# Patient Record
Sex: Female | Born: 1940 | Race: White | Hispanic: No | Smoking: Never smoker
Health system: Southern US, Community
[De-identification: ages and names within clinical notes are randomized; demographics above are authoritative.]

## PROBLEM LIST (undated history)

## (undated) DIAGNOSIS — E785 Hyperlipidemia, unspecified: Secondary | ICD-10-CM

## (undated) DIAGNOSIS — M1990 Unspecified osteoarthritis, unspecified site: Secondary | ICD-10-CM

## (undated) DIAGNOSIS — H269 Unspecified cataract: Secondary | ICD-10-CM

## (undated) DIAGNOSIS — E079 Disorder of thyroid, unspecified: Secondary | ICD-10-CM

## (undated) DIAGNOSIS — I10 Essential (primary) hypertension: Secondary | ICD-10-CM

## (undated) HISTORY — DX: Unspecified cataract: H26.9

## (undated) HISTORY — DX: Disorder of thyroid, unspecified: E07.9

## (undated) HISTORY — DX: Unspecified osteoarthritis, unspecified site: M19.90

## (undated) HISTORY — DX: Hyperlipidemia, unspecified: E78.5

## (undated) HISTORY — DX: Essential (primary) hypertension: I10

## (undated) HISTORY — PX: EYE SURGERY: SHX253

---

## 1985-04-21 HISTORY — PX: DILATION AND CURETTAGE OF UTERUS: SHX78

## 1985-04-21 HISTORY — PX: ABDOMINAL HYSTERECTOMY: SHX81

## 1999-07-17 ENCOUNTER — Encounter: Admission: RE | Admit: 1999-07-17 | Discharge: 1999-07-17 | Payer: Self-pay | Admitting: Obstetrics and Gynecology

## 1999-07-17 ENCOUNTER — Encounter: Payer: Self-pay | Admitting: Obstetrics and Gynecology

## 2000-07-27 ENCOUNTER — Encounter: Admission: RE | Admit: 2000-07-27 | Discharge: 2000-07-27 | Payer: Self-pay | Admitting: Obstetrics and Gynecology

## 2000-07-27 ENCOUNTER — Encounter: Payer: Self-pay | Admitting: Obstetrics and Gynecology

## 2001-08-24 ENCOUNTER — Encounter: Payer: Self-pay | Admitting: Obstetrics and Gynecology

## 2001-08-24 ENCOUNTER — Encounter: Admission: RE | Admit: 2001-08-24 | Discharge: 2001-08-24 | Payer: Self-pay | Admitting: Obstetrics and Gynecology

## 2002-05-30 ENCOUNTER — Encounter: Payer: Self-pay | Admitting: Surgery

## 2002-05-31 ENCOUNTER — Observation Stay (HOSPITAL_COMMUNITY): Admission: RE | Admit: 2002-05-31 | Discharge: 2002-06-01 | Payer: Self-pay | Admitting: Surgery

## 2002-05-31 ENCOUNTER — Encounter (INDEPENDENT_AMBULATORY_CARE_PROVIDER_SITE_OTHER): Payer: Self-pay

## 2002-05-31 HISTORY — PX: CHOLECYSTECTOMY: SHX55

## 2002-09-19 ENCOUNTER — Encounter: Admission: RE | Admit: 2002-09-19 | Discharge: 2002-09-19 | Payer: Self-pay | Admitting: Obstetrics and Gynecology

## 2002-09-19 ENCOUNTER — Encounter: Payer: Self-pay | Admitting: Obstetrics and Gynecology

## 2003-10-18 ENCOUNTER — Ambulatory Visit (HOSPITAL_COMMUNITY): Admission: RE | Admit: 2003-10-18 | Discharge: 2003-10-18 | Payer: Self-pay | Admitting: Obstetrics and Gynecology

## 2004-10-21 ENCOUNTER — Ambulatory Visit (HOSPITAL_COMMUNITY): Admission: RE | Admit: 2004-10-21 | Discharge: 2004-10-21 | Payer: Self-pay | Admitting: Obstetrics and Gynecology

## 2005-10-23 ENCOUNTER — Ambulatory Visit (HOSPITAL_COMMUNITY): Admission: RE | Admit: 2005-10-23 | Discharge: 2005-10-23 | Payer: Self-pay | Admitting: Obstetrics and Gynecology

## 2006-10-28 ENCOUNTER — Ambulatory Visit (HOSPITAL_COMMUNITY): Admission: RE | Admit: 2006-10-28 | Discharge: 2006-10-28 | Payer: Self-pay | Admitting: Obstetrics and Gynecology

## 2007-11-01 ENCOUNTER — Ambulatory Visit (HOSPITAL_COMMUNITY): Admission: RE | Admit: 2007-11-01 | Discharge: 2007-11-01 | Payer: Self-pay | Admitting: Obstetrics and Gynecology

## 2008-11-01 ENCOUNTER — Ambulatory Visit (HOSPITAL_COMMUNITY): Admission: RE | Admit: 2008-11-01 | Discharge: 2008-11-01 | Payer: Self-pay | Admitting: Obstetrics and Gynecology

## 2009-11-05 ENCOUNTER — Ambulatory Visit (HOSPITAL_COMMUNITY): Admission: RE | Admit: 2009-11-05 | Discharge: 2009-11-05 | Payer: Self-pay | Admitting: Obstetrics and Gynecology

## 2010-08-05 ENCOUNTER — Encounter: Payer: Self-pay | Admitting: Family Medicine

## 2010-08-05 DIAGNOSIS — E079 Disorder of thyroid, unspecified: Secondary | ICD-10-CM | POA: Insufficient documentation

## 2010-08-05 DIAGNOSIS — I10 Essential (primary) hypertension: Secondary | ICD-10-CM

## 2010-08-05 DIAGNOSIS — E785 Hyperlipidemia, unspecified: Secondary | ICD-10-CM

## 2010-09-06 NOTE — Op Note (Signed)
NAME:  Sherry Cooper, Sherry Cooper                          ACCOUNT NO.:  [id number]   MEDICAL RECORD NO.:  [id number]                   PATIENT TYPE:  AMB   LOCATION:  DAY                                  FACILITY:  [facility]   PHYSICIAN:  Abigail Miyamoto, M.D.              DATE OF BIRTH:  05-03-1940   DATE OF PROCEDURE:  05/31/2002  DATE OF DISCHARGE:                                 OPERATIVE REPORT   PREOPERATIVE DIAGNOSIS:  Symptomatic cholelithiasis.   POSTOPERATIVE DIAGNOSIS:  Symptomatic cholelithiasis.   PROCEDURE:  Laparoscopic cholecystectomy.   SURGEON:  Abigail Miyamoto, M.D.   ASSISTANT:  Donnie Coffin. Samuella Cota, M.D.   ANESTHESIA:  General endotracheal anesthesia.   ESTIMATED BLOOD LOSS:  Minimal.   DESCRIPTION OF PROCEDURE:  The patient was brought to the operating room and  positively identified.  She was placed supine on the operating table and  general anesthesia was induced.  Her abdomen was then prepped and draped in  the usual sterile fashion.  Using a #15 blade, a small transverse incision  was made below the umbilicus.  The incision was carried down to the fascia,  which was then opened with a scalpel.  A hemostat was then used to pass  through the peritoneal cavity.  Next a balloon port was placed through the  incision of the umbilicus.  Insufflation of the abdomen was begun.  An 11 mm  port was placed in the patient's epigastrium and two 5 mm ports were placed  in the patient's right flank under direct vision.  The gallbladder was then  identified and grasped, retracted above the liver bed.  The gallbladder was  chronically scarred in appearance and appeared to also have a large stone.  The cystic duct was dissected out and clipped three times proximally, once  distally, and transected with the scissors.  The cystic artery was then  identified and clipped twice proximally, once distally, and transected as  well.  The gallbladder was then slowly dissected free from the liver  bed  with the electrocautery.  Once it was freed from the liver bed, it was  placed in an Endo Sac.  The liver bed was again examined and hemostasis was  felt to be achieved.  The gallbladder was then removed through the incision  at the umbilicus.  The abdomen was then again irrigated with normal saline  and again hemostasis appeared to be achieved.  All ports were removed under  direct vision and the abdomen was deflated.  A 0 Vicryl figure-of-eight  suture was then placed around the fascial opening at the umbilicus, closing  the fascial defect.  All incisions were then anesthetized with 0.25%  Marcaine and closed with 4-0 Monocryl subcuticular suture.  Steri-Strips,  gauze, and tape were then applied.  The patient tolerated the procedure  well.  All sponge, needle, and instrument counts were correct at the end of  the  procedure.  The patient was then extubated in the operating room and  taken in stable condition to the recovery room.                                               Abigail Miyamoto, M.D.    DB/MEDQ  D:  05/31/2002  T:  05/31/2002  Job:  [id number]

## 2010-10-28 ENCOUNTER — Other Ambulatory Visit: Payer: Self-pay | Admitting: Family Medicine

## 2010-10-28 DIAGNOSIS — Z1231 Encounter for screening mammogram for malignant neoplasm of breast: Secondary | ICD-10-CM

## 2010-11-08 ENCOUNTER — Ambulatory Visit (HOSPITAL_COMMUNITY)
Admission: RE | Admit: 2010-11-08 | Discharge: 2010-11-08 | Disposition: A | Discharge status: Home / Self Care | Payer: Medicare Other | Source: Ambulatory Visit | Attending: Family Medicine | Admitting: Family Medicine

## 2010-11-08 DIAGNOSIS — Z1231 Encounter for screening mammogram for malignant neoplasm of breast: Secondary | ICD-10-CM

## 2010-11-20 ENCOUNTER — Ambulatory Visit (HOSPITAL_COMMUNITY)
Admission: RE | Admit: 2010-11-20 | Discharge: 2010-11-20 | Disposition: A | Discharge status: Home / Self Care | Payer: Medicare Other | Source: Ambulatory Visit | Attending: Family Medicine | Admitting: Family Medicine

## 2010-11-20 DIAGNOSIS — Z1231 Encounter for screening mammogram for malignant neoplasm of breast: Secondary | ICD-10-CM | POA: Insufficient documentation

## 2011-01-29 ENCOUNTER — Encounter: Payer: Self-pay | Admitting: Gastroenterology

## 2011-02-11 ENCOUNTER — Ambulatory Visit (AMBULATORY_SURGERY_CENTER): Payer: Medicare Other | Admitting: *Deleted

## 2011-02-11 ENCOUNTER — Encounter: Payer: Self-pay | Admitting: Gastroenterology

## 2011-02-11 VITALS — Ht 58.5 in | Wt 173.9 lb

## 2011-02-11 DIAGNOSIS — Z1211 Encounter for screening for malignant neoplasm of colon: Secondary | ICD-10-CM

## 2011-02-11 MED ORDER — PEG-KCL-NACL-NASULF-NA ASC-C 100 G PO SOLR: ORAL | Status: DC

## 2011-02-25 ENCOUNTER — Ambulatory Visit (AMBULATORY_SURGERY_CENTER): Payer: Medicare Other | Admitting: Gastroenterology

## 2011-02-25 ENCOUNTER — Encounter: Payer: Self-pay | Admitting: Gastroenterology

## 2011-02-25 VITALS — BP 118/57 | HR 78 | Temp 97.6°F | Resp 18 | Ht 58.5 in | Wt 173.0 lb

## 2011-02-25 DIAGNOSIS — Z1211 Encounter for screening for malignant neoplasm of colon: Secondary | ICD-10-CM

## 2011-02-25 MED ORDER — SODIUM CHLORIDE 0.9 % IV SOLN: 500.0000 mL | INTRAVENOUS | Status: DC

## 2011-02-26 ENCOUNTER — Telehealth: Payer: Self-pay | Admitting: *Deleted

## 2011-02-26 NOTE — Telephone Encounter (Signed)
Follow up Call- Patient questions:  Do you have a fever, pain , or abdominal swelling? no Pain Score  0 *  Have you tolerated food without any problems? yes  Have you been able to return to your normal activities? yes  Do you have any questions about your discharge instructions: Diet   no Medications  yes answered Follow up visit  no  Do you have questions or concerns about your Care? no  Actions: * If pain score is 4 or above: No action needed, pain <4.

## 2011-11-03 ENCOUNTER — Other Ambulatory Visit: Payer: Self-pay | Admitting: Family Medicine

## 2011-11-03 DIAGNOSIS — Z1231 Encounter for screening mammogram for malignant neoplasm of breast: Secondary | ICD-10-CM

## 2011-11-25 ENCOUNTER — Ambulatory Visit (HOSPITAL_COMMUNITY)
Admission: RE | Admit: 2011-11-25 | Discharge: 2011-11-25 | Disposition: A | Discharge status: Home / Self Care | Payer: Medicare Other | Source: Ambulatory Visit | Attending: Family Medicine | Admitting: Family Medicine

## 2011-11-25 DIAGNOSIS — Z1231 Encounter for screening mammogram for malignant neoplasm of breast: Secondary | ICD-10-CM | POA: Insufficient documentation

## 2012-07-23 ENCOUNTER — Other Ambulatory Visit: Payer: Self-pay | Admitting: Family Medicine

## 2012-07-23 DIAGNOSIS — E039 Hypothyroidism, unspecified: Secondary | ICD-10-CM

## 2012-08-11 ENCOUNTER — Encounter: Payer: Self-pay | Admitting: *Deleted

## 2012-10-18 ENCOUNTER — Ambulatory Visit: Payer: Self-pay | Admitting: Family Medicine

## 2012-10-25 ENCOUNTER — Encounter: Payer: Self-pay | Admitting: Family Medicine

## 2012-10-25 ENCOUNTER — Telehealth: Payer: Self-pay | Admitting: Family Medicine

## 2012-10-25 ENCOUNTER — Ambulatory Visit (INDEPENDENT_AMBULATORY_CARE_PROVIDER_SITE_OTHER): Payer: Medicare Other | Admitting: Family Medicine

## 2012-10-25 VITALS — BP 130/71 | HR 97 | Temp 97.0°F | Ht 59.0 in | Wt 166.6 lb

## 2012-10-25 DIAGNOSIS — E785 Hyperlipidemia, unspecified: Secondary | ICD-10-CM

## 2012-10-25 DIAGNOSIS — I10 Essential (primary) hypertension: Secondary | ICD-10-CM

## 2012-10-25 DIAGNOSIS — E669 Obesity, unspecified: Secondary | ICD-10-CM

## 2012-10-25 DIAGNOSIS — R635 Abnormal weight gain: Secondary | ICD-10-CM

## 2012-10-25 DIAGNOSIS — E079 Disorder of thyroid, unspecified: Secondary | ICD-10-CM

## 2012-10-25 LAB — COMPLETE METABOLIC PANEL WITH GFR
ALT: 16 U/L (ref 0–35)
AST: 21 U/L (ref 0–37)
Albumin: 4.5 g/dL (ref 3.5–5.2)
Alkaline Phosphatase: 100 U/L (ref 39–117)
BUN: 18 mg/dL (ref 6–23)
CO2: 26 mEq/L (ref 19–32)
Calcium: 9.9 mg/dL (ref 8.4–10.5)
Chloride: 97 mEq/L (ref 96–112)
Creat: 1.16 mg/dL — ABNORMAL HIGH (ref 0.50–1.10)
GFR, Est African American: 55 mL/min — ABNORMAL LOW
GFR, Est Non African American: 47 mL/min — ABNORMAL LOW
Glucose, Bld: 105 mg/dL — ABNORMAL HIGH (ref 70–99)
Potassium: 4.5 mEq/L (ref 3.5–5.3)
Sodium: 133 mEq/L — ABNORMAL LOW (ref 135–145)
Total Bilirubin: 0.3 mg/dL (ref 0.3–1.2)
Total Protein: 7.4 g/dL (ref 6.0–8.3)

## 2012-10-25 LAB — TSH: TSH: 3.102 u[IU]/mL (ref 0.350–4.500)

## 2012-10-25 MED ORDER — ROSUVASTATIN CALCIUM 10 MG PO TABS: 10.0000 mg | ORAL_TABLET | Freq: Every day | ORAL | Status: DC

## 2012-10-25 MED ORDER — LISINOPRIL-HYDROCHLOROTHIAZIDE 10-12.5 MG PO TABS: 1.0000 | ORAL_TABLET | Freq: Every day | ORAL | Status: DC

## 2012-10-25 MED ORDER — LEVOTHYROXINE SODIUM 50 MCG PO TABS: 50.0000 ug | ORAL_TABLET | Freq: Every day | ORAL | Status: DC

## 2012-10-25 NOTE — Progress Notes (Signed)
Patient ID: Sherry Cooper, female   DOB: 10-28-1940, 72 y.o.   MRN: [id number] SUBJECTIVE: CC: Chief Complaint  Patient presents with  . Follow-up    4 month follow up . fastng   . Medication Refill    needs refills    HPI: Patient is here for follow up of hypertension/Hyperlipidemia/hypothyroidism/metabolic syndrome: denies Headache;deniesChest Pain;denies weakness;denies Shortness of Breath or Orthopnea;denies Visual changes;denies palpitations;denies cough;denies pedal edema;denies symptoms of TIA or stroke; admits to Compliance with medications. denies Problems with medications. Has problems of nausea with raw veggies.  Past Medical History  Diagnosis Date  . Thyroid disease   . Hypertension   . Hyperlipidemia   . Arthritis    Past Surgical History  Procedure Laterality Date  . Cholecystectomy  05/31/2002    laparoscopic  . Dilation and curettage of uterus  1987  . Abdominal hysterectomy  1987   History   Social History  . Marital Status: Widowed    Spouse Name: N/A    Number of Children: N/A  . Years of Education: N/A   Occupational History  . Not on file.   Social History Main Topics  . Smoking status: Never Smoker   . Smokeless tobacco: Not on file  . Alcohol Use: No  . Drug Use: No  . Sexually Active: Not on file   Other Topics Concern  . Not on file   Social History Narrative  . No narrative on file   Family History  Problem Relation Age of Onset  . Colon cancer Neg Hx   . Stomach cancer Neg Hx    Current Outpatient Prescriptions on File Prior to Visit  Medication Sig Dispense Refill  . aspirin 81 MG tablet Take 81 mg by mouth daily.        . Calcium-Vitamin D (CALTRATE 600 PLUS-VIT D PO) Take 2 tablets by mouth daily.       Marland Kitchen lisinopril-hydrochlorothiazide (PRINZIDE,ZESTORETIC) 10-12.5 MG per tablet Take 1 tablet by mouth daily.        . Multiple Vitamin (MULTIVITAMIN) tablet Take 1 tablet by mouth daily.        . Omega-3 Fatty Acids (OMEGA 3  PO) Take 2 g by mouth 2 (two) times daily.        . rosuvastatin (CRESTOR) 10 MG tablet Take 10 mg by mouth daily.         No current facility-administered medications on file prior to visit.   No Known Allergies  There is no immunization history on file for this patient. Prior to Admission medications   Medication Sig Start Date End Date Taking? Authorizing Provider  aspirin 81 MG tablet Take 81 mg by mouth daily.     Yes Historical Provider, MD  Calcium-Vitamin D (CALTRATE 600 PLUS-VIT D PO) Take 2 tablets by mouth daily.    Yes Historical Provider, MD  levothyroxine (SYNTHROID, LEVOTHROID) 50 MCG tablet Take 50 mcg by mouth daily before breakfast.   Yes Historical Provider, MD  lisinopril-hydrochlorothiazide (PRINZIDE,ZESTORETIC) 10-12.5 MG per tablet Take 1 tablet by mouth daily.     Yes Historical Provider, MD  Multiple Vitamin (MULTIVITAMIN) tablet Take 1 tablet by mouth daily.     Yes Historical Provider, MD  Omega-3 Fatty Acids (OMEGA 3 PO) Take 2 g by mouth 2 (two) times daily.     Yes Historical Provider, MD  rosuvastatin (CRESTOR) 10 MG tablet Take 10 mg by mouth daily.     Yes Historical Provider, MD    ROS: As  above in the HPI. All other systems are stable or negative.  OBJECTIVE: APPEARANCE:  Patient in no acute distress.The patient appeared well nourished and normally developed. Acyanotic. Waist:39 1/2 inches VITAL SIGNS:BP 130/71  Pulse 97  Temp(Src) 97 F (36.1 C) (Oral)  Ht 4\' 11"  (1.499 m)  Wt 166 lb 9.6 oz (75.569 kg)  BMI 33.63 kg/m2 WF short stature Obesity  SKIN: warm and  Dry without overt rashes, tattoos and scars  HEAD and Neck: without JVD, Head and scalp: normal Eyes:No scleral icterus. Fundi normal, eye movements normal. Ears: Auricle normal, canal normal, Tympanic membranes normal, insufflation normal. Nose: normal Throat: normal Neck & thyroid: normal  CHEST & LUNGS: Chest wall: normal Lungs: Clear  CVS: Reveals the PMI to be normally  located. Regular rhythm, First and Second Heart sounds are normal,  absence of murmurs, rubs or gallops. Peripheral vasculature: Radial pulses: normal Dorsal pedis pulses: normal Posterior pulses: normal  ABDOMEN:  Appearance:Obesity Benign, no organomegaly, no masses, no Abdominal Aortic enlargement. No Guarding , no rebound. No Bruits. Bowel sounds: normal  RECTAL: N/A GU: N/A  EXTREMETIES: nonedematous. Both Femoral and Pedal pulses are normal.  MUSCULOSKELETAL:  Spine: normal Joints: intact  NEUROLOGIC: oriented to time,place and person; nonfocal. Strength is normal Sensory is normal Reflexes are normal Cranial Nerves are normal.  Results for orders placed in visit on 08/05/10  HM MAMMOGRAPHY      Result Value Range   HM Mammogram done    HM PAP SMEAR      Result Value Range   HM Pap smear done      ASSESSMENT: Obesity, unspecified  Hypertension - Plan: lisinopril-hydrochlorothiazide (PRINZIDE,ZESTORETIC) 10-12.5 MG per tablet, COMPLETE METABOLIC PANEL WITH GFR  Thyroid disease - Plan: levothyroxine (SYNTHROID, LEVOTHROID) 50 MCG tablet, TSH  Hyperlipidemia - Plan: rosuvastatin (CRESTOR) 10 MG tablet, COMPLETE METABOLIC PANEL WITH GFR, NMR Lipoprofile with Lipids  Has nausea with raw veggies. Recommend cooked veggies.  PLAN: Orders Placed This Encounter  Procedures  . TSH  . COMPLETE METABOLIC PANEL WITH GFR  . NMR Lipoprofile with Lipids   Meds ordered this encounter  Medications  . DISCONTD: levothyroxine (SYNTHROID, LEVOTHROID) 50 MCG tablet    Sig: Take 50 mcg by mouth daily before breakfast.  . levothyroxine (SYNTHROID, LEVOTHROID) 50 MCG tablet    Sig: Take 1 tablet (50 mcg total) by mouth daily before breakfast.    Dispense:  30 tablet    Refill:  11  . lisinopril-hydrochlorothiazide (PRINZIDE,ZESTORETIC) 10-12.5 MG per tablet    Sig: Take 1 tablet by mouth daily.    Dispense:  30 tablet    Refill:  11  . rosuvastatin (CRESTOR) 10 MG  tablet    Sig: Take 1 tablet (10 mg total) by mouth daily.    Dispense:  30 tablet    Refill:  11        Dr Woodroe Mode Recommendations  Diet and Exercise discussed with patient.  For nutrition information, I recommend books:  1).Eat to Live by Dr Monico Hoar. 2).Prevent and Reverse Heart Disease by Dr Suzzette Righter. 3) Dr Katherina Right Book:  Program to Reverse Diabetes  Exercise recommendations are:  If unable to walk, then the patient can exercise in a chair 3 times a day. By flapping arms like a bird gently and raising legs outwards to the front.  If ambulatory, the patient can go for walks for 30 minutes 3 times a week. Then increase the intensity and duration as tolerated.  Goal is to try to attain exercise frequency to 5 times a week.  If applicable: Best to perform resistance exercises (machines or weights) 2 days a week and cardio type exercises 3 days per week.  DASH diet Weight reduction recommended. counselled on LTC.  Return in about 4 months (around 02/25/2013) for Recheck medical problems.  Antonina Deziel P. Modesto Charon, M.D.

## 2012-10-25 NOTE — Patient Instructions (Signed)
    Dr Shalaunda Weatherholtz's Recommendations  Diet and Exercise discussed with patient.  For nutrition information, I recommend books:  1).Eat to Live by Dr Joel Fuhrman. 2).Prevent and Reverse Heart Disease by Dr Caldwell Esselstyn. 3) Dr Neal Barnard's Book:  Program to Reverse Diabetes  Exercise recommendations are:  If unable to walk, then the patient can exercise in a chair 3 times a day. By flapping arms like a bird gently and raising legs outwards to the front.  If ambulatory, the patient can go for walks for 30 minutes 3 times a week. Then increase the intensity and duration as tolerated.  Goal is to try to attain exercise frequency to 5 times a week.  If applicable: Best to perform resistance exercises (machines or weights) 2 days a week and cardio type exercises 3 days per week.    DASH Diet The DASH diet stands for "Dietary Approaches to Stop Hypertension." It is a healthy eating plan that has been shown to reduce high blood pressure (hypertension) in as little as 14 days, while also possibly providing other significant health benefits. These other health benefits include reducing the risk of breast cancer after menopause and reducing the risk of type 2 diabetes, heart disease, colon cancer, and stroke. Health benefits also include weight loss and slowing kidney failure in patients with chronic kidney disease.  DIET GUIDELINES  Limit salt (sodium). Your diet should contain less than 1500 mg of sodium daily.  Limit refined or processed carbohydrates. Your diet should include mostly whole grains. Desserts and added sugars should be used sparingly.  Include small amounts of heart-healthy fats. These types of fats include nuts, oils, and tub margarine. Limit saturated and trans fats. These fats have been shown to be harmful in the body. CHOOSING FOODS  The following food groups are based on a 2000 calorie diet. See your Registered Dietitian for individual calorie needs. Grains and  Grain Products (6 to 8 servings daily)  Eat More Often: Whole-wheat bread, brown rice, whole-grain or wheat pasta, quinoa, popcorn without added fat or salt (air popped).  Eat Less Often: White bread, white pasta, white rice, cornbread. Vegetables (4 to 5 servings daily)  Eat More Often: Fresh, frozen, and canned vegetables. Vegetables may be raw, steamed, roasted, or grilled with a minimal amount of fat.  Eat Less Often/Avoid: Creamed or fried vegetables. Vegetables in a cheese sauce. Fruit (4 to 5 servings daily)  Eat More Often: All fresh, canned (in natural juice), or frozen fruits. Dried fruits without added sugar. One hundred percent fruit juice ( cup [237 mL] daily).  Eat Less Often: Dried fruits with added sugar. Canned fruit in light or heavy syrup. Lean Meats, Fish, and Poultry (2 servings or less daily. One serving is 3 to 4 oz [85-114 g]).  Eat More Often: Ninety percent or leaner ground beef, tenderloin, sirloin. Round cuts of beef, chicken breast, turkey breast. All fish. Grill, bake, or broil your meat. Nothing should be fried.  Eat Less Often/Avoid: Fatty cuts of meat, turkey, or chicken leg, thigh, or wing. Fried cuts of meat or fish. Dairy (2 to 3 servings)  Eat More Often: Low-fat or fat-free milk, low-fat plain or light yogurt, reduced-fat or part-skim cheese.  Eat Less Often/Avoid: Milk (whole, 2%).Whole milk yogurt. Full-fat cheeses. Nuts, Seeds, and Legumes (4 to 5 servings per week)  Eat More Often: All without added salt.  Eat Less Often/Avoid: Salted nuts and seeds, canned beans with added salt. Fats and Sweets (  limited)  Eat More Often: Vegetable oils, tub margarines without trans fats, sugar-free gelatin. Mayonnaise and salad dressings.  Eat Less Often/Avoid: Coconut oils, palm oils, butter, stick margarine, cream, half and half, cookies, candy, pie. FOR MORE INFORMATION The Dash Diet Eating Plan: www.dashdiet.org Document Released: 03/27/2011  Document Revised: 06/30/2011 Document Reviewed: 03/27/2011 [employer] Patient Information 2014 [employer].  

## 2012-10-26 LAB — NMR LIPOPROFILE WITH LIPIDS
Cholesterol, Total: 139 mg/dL (ref <200)
HDL Particle Number: 36.2 umol/L (ref >=30.5)
HDL Size: 10 nm (ref >=9.2)
HDL-C: 58 mg/dL (ref >=40)
LDL (calc): 67 mg/dL (ref <100)
LDL Particle Number: 578 nmol/L (ref <1000)
LDL Size: 20.5 nm — ABNORMAL LOW (ref >20.5)
LP-IR Score: 35 (ref <=45)
Large HDL-P: 11.5 umol/L (ref >=4.8)
Large VLDL-P: 2.6 nmol/L (ref <=2.7)
Small LDL Particle Number: 346 nmol/L (ref <=527)
Triglycerides: 72 mg/dL (ref <150)
VLDL Size: 51.1 nm — ABNORMAL HIGH (ref <=46.6)

## 2012-10-26 MED ORDER — ROSUVASTATIN CALCIUM 10 MG PO TABS: 20.0000 mg | ORAL_TABLET | Freq: Every day | ORAL | Status: DC

## 2012-10-26 NOTE — Telephone Encounter (Signed)
Spoke with pt she thought she was on crestor 10 and is on crestor 20mg . [facility] notified.

## 2012-10-31 ENCOUNTER — Other Ambulatory Visit: Payer: Self-pay | Admitting: Family Medicine

## 2012-10-31 DIAGNOSIS — R899 Unspecified abnormal finding in specimens from other organs, systems and tissues: Secondary | ICD-10-CM

## 2012-10-31 NOTE — Progress Notes (Signed)
Quick Note:  Labs abnormal. The creatinine Went up a little adn the sugar is borderline Elevated. Need to come in to do a HGBa1C and Repeat the creatinine ( ordered in EPIC) to see if we need to change her medications. Thanks ______

## 2012-11-04 ENCOUNTER — Other Ambulatory Visit: Payer: Self-pay | Admitting: Family Medicine

## 2012-11-04 DIAGNOSIS — Z1231 Encounter for screening mammogram for malignant neoplasm of breast: Secondary | ICD-10-CM

## 2012-11-08 ENCOUNTER — Telehealth: Payer: Self-pay | Admitting: Family Medicine

## 2012-11-09 NOTE — Telephone Encounter (Signed)
Pt notified lab results given 

## 2012-11-12 ENCOUNTER — Other Ambulatory Visit (INDEPENDENT_AMBULATORY_CARE_PROVIDER_SITE_OTHER): Payer: Medicare Other

## 2012-11-12 DIAGNOSIS — E039 Hypothyroidism, unspecified: Secondary | ICD-10-CM

## 2012-11-12 DIAGNOSIS — R899 Unspecified abnormal finding in specimens from other organs, systems and tissues: Secondary | ICD-10-CM

## 2012-11-13 LAB — CREATININE, SERUM: Creat: 1.06 mg/dL (ref 0.50–1.10)

## 2012-11-13 LAB — THYROID PANEL WITH TSH
Free Thyroxine Index: 3.3 (ref 1.0–3.9)
T3 Uptake: 35.4 % (ref 22.5–37.0)
T4, Total: 9.3 ug/dL (ref 5.0–12.5)
TSH: 3.412 u[IU]/mL (ref 0.350–4.500)

## 2012-11-13 LAB — HEMOGLOBIN A1C
Hgb A1c MFr Bld: 5.9 % — ABNORMAL HIGH (ref <5.7)
Mean Plasma Glucose: 123 mg/dL — ABNORMAL HIGH (ref <117)

## 2012-11-15 NOTE — Progress Notes (Signed)
Quick Note:  Lab result at goal. She is Prediabetic. Needs to reduce sugars simple carbs limit to 2 servings of slowly digested fruits. Ie., avoid citrus and juices.eat veggies 4 to 6 servings a day.exercise keep active. Keep routine follow up. No change in Medications for now. No Change in plans and follow up. ______

## 2012-12-01 ENCOUNTER — Ambulatory Visit (HOSPITAL_COMMUNITY): Payer: Medicare Other

## 2012-12-08 ENCOUNTER — Ambulatory Visit (HOSPITAL_COMMUNITY)
Admission: RE | Admit: 2012-12-08 | Discharge: 2012-12-08 | Disposition: A | Discharge status: Home / Self Care | Payer: Medicare Other | Source: Ambulatory Visit | Attending: Family Medicine | Admitting: Family Medicine

## 2012-12-08 DIAGNOSIS — Z1231 Encounter for screening mammogram for malignant neoplasm of breast: Secondary | ICD-10-CM | POA: Insufficient documentation

## 2013-01-03 ENCOUNTER — Encounter: Payer: Self-pay | Admitting: *Deleted

## 2013-02-24 ENCOUNTER — Ambulatory Visit (INDEPENDENT_AMBULATORY_CARE_PROVIDER_SITE_OTHER): Payer: Medicare Other | Admitting: Family Medicine

## 2013-02-24 ENCOUNTER — Encounter: Payer: Self-pay | Admitting: Family Medicine

## 2013-02-24 VITALS — BP 123/63 | HR 84 | Temp 98.3°F | Ht 58.5 in | Wt 141.4 lb

## 2013-02-24 DIAGNOSIS — E079 Disorder of thyroid, unspecified: Secondary | ICD-10-CM

## 2013-02-24 DIAGNOSIS — E785 Hyperlipidemia, unspecified: Secondary | ICD-10-CM

## 2013-02-24 DIAGNOSIS — E669 Obesity, unspecified: Secondary | ICD-10-CM

## 2013-02-24 DIAGNOSIS — I10 Essential (primary) hypertension: Secondary | ICD-10-CM

## 2013-02-24 NOTE — Progress Notes (Signed)
Patient ID: Sherry Cooper, female   DOB: July 26, 1940, 72 y.o.   MRN: [id number] SUBJECTIVE: CC: Chief Complaint  Patient presents with  . Follow-up    4 month follow up chronic problems     HPI: Patient is here for follow up of hyperlipidemia/htn/obesity: denies Headache;denies Chest Pain;denies weakness;denies Shortness of Breath and orthopnea;denies Visual changes;denies palpitations;denies cough;denies pedal edema;denies symptoms of TIA or stroke;deniesClaudication symptoms. admits to Compliance with medications; denies Problems with medications.  Changed her diet lost weight and feels great!!! Family worried about her weight loss and they are fat. She put her mind to it to lose weight. She has accomplished her goals and wanted to know her final goals. Past Medical History  Diagnosis Date  . Thyroid disease   . Hypertension   . Hyperlipidemia   . Arthritis    Past Surgical History  Procedure Laterality Date  . Cholecystectomy  05/31/2002    laparoscopic  . Dilation and curettage of uterus  1987  . Abdominal hysterectomy  1987   History   Social History  . Marital Status: Widowed    Spouse Name: N/A    Number of Children: N/A  . Years of Education: N/A   Occupational History  . Not on file.   Social History Main Topics  . Smoking status: Never Smoker   . Smokeless tobacco: Not on file  . Alcohol Use: No  . Drug Use: No  . Sexual Activity: Not on file   Other Topics Concern  . Not on file   Social History Narrative  . No narrative on file   Family History  Problem Relation Age of Onset  . Colon cancer Neg Hx   . Stomach cancer Neg Hx    Current Outpatient Prescriptions on File Prior to Visit  Medication Sig Dispense Refill  . aspirin 81 MG tablet Take 81 mg by mouth daily.        . Calcium-Vitamin D (CALTRATE 600 PLUS-VIT D PO) Take 2 tablets by mouth daily.       . CRESTOR 20 MG tablet Take 20 mg by mouth daily.       Marland Kitchen levothyroxine (SYNTHROID,  LEVOTHROID) 50 MCG tablet Take 1 tablet (50 mcg total) by mouth daily before breakfast.  30 tablet  11  . lisinopril-hydrochlorothiazide (PRINZIDE,ZESTORETIC) 10-12.5 MG per tablet Take 1 tablet by mouth daily.  30 tablet  11  . Multiple Vitamin (MULTIVITAMIN) tablet Take 1 tablet by mouth daily.        . Omega-3 Fatty Acids (OMEGA 3 PO) Take 2 g by mouth 2 (two) times daily.         No current facility-administered medications on file prior to visit.   No Known Allergies Immunization History  Administered Date(s) Administered  . Influenza,inj,Quad PF,36+ Mos 01/24/2013   Prior to Admission medications   Medication Sig Start Date End Date Taking? Authorizing Provider  aspirin 81 MG tablet Take 81 mg by mouth daily.     Yes Historical Provider, MD  Calcium-Vitamin D (CALTRATE 600 PLUS-VIT D PO) Take 2 tablets by mouth daily.    Yes Historical Provider, MD  CRESTOR 20 MG tablet Take 20 mg by mouth daily.  10/07/12  Yes Historical Provider, MD  levothyroxine (SYNTHROID, LEVOTHROID) 50 MCG tablet Take 1 tablet (50 mcg total) by mouth daily before breakfast. 10/25/12  Yes Ileana Ladd, MD  lisinopril-hydrochlorothiazide (PRINZIDE,ZESTORETIC) 10-12.5 MG per tablet Take 1 tablet by mouth daily. 10/25/12  Yes Ileana Ladd,  MD  Multiple Vitamin (MULTIVITAMIN) tablet Take 1 tablet by mouth daily.     Yes Historical Provider, MD  Omega-3 Fatty Acids (OMEGA 3 PO) Take 2 g by mouth 2 (two) times daily.     Yes Historical Provider, MD     ROS: As above in the HPI. All other systems are stable or negative.  OBJECTIVE: APPEARANCE:  Patient in no acute distress.The patient appeared well nourished and normally developed. Acyanotic. Waist: VITAL SIGNS:BP 123/63  Pulse 84  Temp(Src) 98.3 F (36.8 C) (Oral)  Ht 4' 10.5" (1.486 m)  Wt 141 lb 6.4 oz (64.139 kg)  BMI 29.05 kg/m2 WF trimmer and looking great and more ambulatory. Now overweight instead of obese.  SKIN: warm and  Dry without overt  rashes, tattoos and scars  HEAD and Neck: without JVD, Head and scalp: normal Eyes:No scleral icterus. Fundi normal, eye movements normal. Ears: Auricle normal, canal normal, Tympanic membranes normal, insufflation normal. Nose: normal Throat: normal Neck & thyroid: normal  CHEST & LUNGS: Chest wall: normal Lungs: Clear  CVS: Reveals the PMI to be normally located. Regular rhythm, First and Second Heart sounds are normal,  absence of murmurs, rubs or gallops. Peripheral vasculature: Radial pulses: normal Dorsal pedis pulses: normal Posterior pulses: normal  ABDOMEN:  Appearance: normal Benign, no organomegaly, no masses, no Abdominal Aortic enlargement. No Guarding , no rebound. No Bruits. Bowel sounds: normal  RECTAL: N/A GU: N/A  EXTREMETIES: nonedematous.  MUSCULOSKELETAL:  Spine: normal Joints: intact  NEUROLOGIC: oriented to time,place and person; nonfocal. Strength is normal Sensory is normal Reflexes are normal Cranial Nerves are normal.  Results for orders placed in visit on 11/12/12  THYROID PANEL WITH TSH      Result Value Range   T4, Total 9.3  5.0 - 12.5 ug/dL   T3 Uptake 40.9  81.1 - 37.0 %   Free Thyroxine Index 3.3  1.0 - 3.9   TSH 3.412  0.350 - 4.500 uIU/mL  CREATININE, SERUM      Result Value Range   Creat 1.06  0.50 - 1.10 mg/dL  HEMOGLOBIN B1Y      Result Value Range   Hemoglobin A1C 5.9 (*) <5.7 %   Mean Plasma Glucose 123 (*) <117 mg/dL    ASSESSMENT: Hyperlipidemia - Plan: CMP14+EGFR, Lipid panel  Hypertension - Plan: CMP14+EGFR  Obesity, unspecified  Thyroid disease - Plan: TSH Doing very well with Lifestyle changes PLAN: Gave her a BMI sheet and her ranges of 105 to 125 LBs. As her final target.  praised patient on her accomplishments.  Orders Placed This Encounter  Procedures  . CMP14+EGFR  . Lipid panel  . TSH   No orders of the defined types were placed in this encounter.   Medications Discontinued During  This Encounter  Medication Reason  . rosuvastatin (CRESTOR) 20 MG tablet Duplicate   Return in about 4 months (around 06/24/2013) for Recheck medical problems.  Ainhoa Rallo P. [place] Charon, M.D.

## 2013-02-25 ENCOUNTER — Other Ambulatory Visit: Payer: Self-pay | Admitting: Family Medicine

## 2013-02-25 DIAGNOSIS — R899 Unspecified abnormal finding in specimens from other organs, systems and tissues: Secondary | ICD-10-CM

## 2013-02-25 LAB — LIPID PANEL
Chol/HDL Ratio: 2 ratio units (ref 0.0–4.4)
Cholesterol, Total: 131 mg/dL (ref 100–199)
HDL: 66 mg/dL (ref >39)
LDL Calculated: 54 mg/dL (ref 0–99)
Triglycerides: 56 mg/dL (ref 0–149)
VLDL Cholesterol Cal: 11 mg/dL (ref 5–40)

## 2013-02-25 LAB — CMP14+EGFR
ALT: 17 IU/L (ref 0–32)
AST: 23 IU/L (ref 0–40)
Albumin/Globulin Ratio: 1.8 (ref 1.1–2.5)
Albumin: 4.5 g/dL (ref 3.5–4.8)
Alkaline Phosphatase: 98 IU/L (ref 39–117)
BUN/Creatinine Ratio: 14 (ref 11–26)
BUN: 15 mg/dL (ref 8–27)
CO2: 25 mmol/L (ref 18–29)
Calcium: 10.6 mg/dL — ABNORMAL HIGH (ref 8.6–10.2)
Chloride: 97 mmol/L (ref 97–108)
Creatinine, Ser: 1.06 mg/dL — ABNORMAL HIGH (ref 0.57–1.00)
GFR calc Af Amer: 61 mL/min/{1.73_m2} (ref >59)
GFR calc non Af Amer: 53 mL/min/{1.73_m2} — ABNORMAL LOW (ref >59)
Globulin, Total: 2.5 g/dL (ref 1.5–4.5)
Glucose: 84 mg/dL (ref 65–99)
Potassium: 5.6 mmol/L — ABNORMAL HIGH (ref 3.5–5.2)
Sodium: 137 mmol/L (ref 134–144)
Total Bilirubin: 0.3 mg/dL (ref 0.0–1.2)
Total Protein: 7 g/dL (ref 6.0–8.5)

## 2013-02-25 LAB — TSH: TSH: 2.83 u[IU]/mL (ref 0.450–4.500)

## 2013-02-25 NOTE — Progress Notes (Signed)
Quick Note:  Call Patient Labs abnormal:potassium is high Rest of the labs at goal.  Recommendations: Need to repeat BMP asap. Ordered in EPIC    ______

## 2013-02-28 ENCOUNTER — Other Ambulatory Visit (INDEPENDENT_AMBULATORY_CARE_PROVIDER_SITE_OTHER): Payer: Medicare Other

## 2013-02-28 DIAGNOSIS — R899 Unspecified abnormal finding in specimens from other organs, systems and tissues: Secondary | ICD-10-CM

## 2013-02-28 DIAGNOSIS — R6889 Other general symptoms and signs: Secondary | ICD-10-CM

## 2013-03-01 ENCOUNTER — Ambulatory Visit: Payer: Medicare Other | Admitting: Family Medicine

## 2013-03-01 LAB — BMP8+EGFR
BUN/Creatinine Ratio: 16 (ref 11–26)
BUN: 14 mg/dL (ref 8–27)
CO2: 25 mmol/L (ref 18–29)
Calcium: 9.9 mg/dL (ref 8.6–10.2)
Chloride: 96 mmol/L — ABNORMAL LOW (ref 97–108)
Creatinine, Ser: 0.87 mg/dL (ref 0.57–1.00)
GFR calc Af Amer: 78 mL/min/{1.73_m2} (ref >59)
GFR calc non Af Amer: 67 mL/min/{1.73_m2} (ref >59)
Glucose: 88 mg/dL (ref 65–99)
Potassium: 4.8 mmol/L (ref 3.5–5.2)
Sodium: 135 mmol/L (ref 134–144)

## 2013-05-10 NOTE — Progress Notes (Signed)
Patient came in for labs only.

## 2013-06-20 ENCOUNTER — Other Ambulatory Visit: Payer: Self-pay | Admitting: Family Medicine

## 2013-06-28 ENCOUNTER — Ambulatory Visit (INDEPENDENT_AMBULATORY_CARE_PROVIDER_SITE_OTHER): Payer: Medicare Other | Admitting: Family Medicine

## 2013-06-28 ENCOUNTER — Encounter: Payer: Self-pay | Admitting: Family Medicine

## 2013-06-28 VITALS — BP 104/59 | HR 75 | Temp 97.0°F | Ht 58.5 in | Wt 140.8 lb

## 2013-06-28 DIAGNOSIS — E079 Disorder of thyroid, unspecified: Secondary | ICD-10-CM

## 2013-06-28 DIAGNOSIS — I10 Essential (primary) hypertension: Secondary | ICD-10-CM

## 2013-06-28 DIAGNOSIS — E669 Obesity, unspecified: Secondary | ICD-10-CM

## 2013-06-28 DIAGNOSIS — E785 Hyperlipidemia, unspecified: Secondary | ICD-10-CM

## 2013-06-28 MED ORDER — LISINOPRIL-HYDROCHLOROTHIAZIDE 10-12.5 MG PO TABS
1.0000 | ORAL_TABLET | Freq: Every day | ORAL | Status: DC
Start: 2013-06-28 — End: 2013-06-28

## 2013-06-28 MED ORDER — LEVOTHYROXINE SODIUM 50 MCG PO TABS: 50.0000 ug | ORAL_TABLET | Freq: Every day | ORAL | Status: DC

## 2013-06-28 MED ORDER — LISINOPRIL-HYDROCHLOROTHIAZIDE 10-12.5 MG PO TABS: 1.0000 | ORAL_TABLET | Freq: Every day | ORAL | Status: DC

## 2013-06-28 NOTE — Progress Notes (Signed)
Patient ID: Sherry Cooper, female   DOB: February 07, 1941, 73 y.o.   MRN: [id number] SUBJECTIVE: CC: Chief Complaint  Patient presents with  . Follow-up    4 month ck up needs 90 day refill on lisinopril , levothyroxine    HPI: Patient is here for follow up of hyperlipidemia/HTN/Obesity/Thyroid: denies Headache;denies Chest Pain;denies weakness;denies Shortness of Breath and orthopnea;denies Visual changes;denies palpitations;denies cough;denies pedal edema;denies symptoms of TIA or stroke;deniesClaudication symptoms. admits to Compliance with medications; denies Problems with medications.  Eating less fatty foods and eating more vegetables and fruits. And this helped her to lose weight and be healthier.  Past Medical History  Diagnosis Date  . Thyroid disease   . Hypertension   . Hyperlipidemia   . Arthritis    Past Surgical History  Procedure Laterality Date  . Cholecystectomy  05/31/2002    laparoscopic  . Dilation and curettage of uterus  1987  . Abdominal hysterectomy  1987   History   Social History  . Marital Status: Widowed    Spouse Name: N/A    Number of Children: N/A  . Years of Education: N/A   Occupational History  . Not on file.   Social History Main Topics  . Smoking status: Never Smoker   . Smokeless tobacco: Not on file  . Alcohol Use: No  . Drug Use: No  . Sexual Activity: Not on file   Other Topics Concern  . Not on file   Social History Narrative  . No narrative on file   Family History  Problem Relation Age of Onset  . Colon cancer Neg Hx   . Stomach cancer Neg Hx    Current Outpatient Prescriptions on File Prior to Visit  Medication Sig Dispense Refill  . aspirin 81 MG tablet Take 81 mg by mouth daily.        . Calcium-Vitamin D (CALTRATE 600 PLUS-VIT D PO) Take 2 tablets by mouth daily.       . CRESTOR 20 MG tablet Take 20 mg by mouth daily.       . Multiple Vitamin (MULTIVITAMIN) tablet Take 1 tablet by mouth daily.        . Omega-3  Fatty Acids (OMEGA 3 PO) Take 2 g by mouth 2 (two) times daily.         No current facility-administered medications on file prior to visit.   No Known Allergies Immunization History  Administered Date(s) Administered  . Influenza,inj,Quad PF,36+ Mos 01/24/2013  . Pneumococcal-Unspecified 11/20/2010  . Tdap 07/21/2006  . Zoster 04/21/2006   Prior to Admission medications   Medication Sig Start Date End Date Taking? Authorizing Provider  aspirin 81 MG tablet Take 81 mg by mouth daily.     Yes Historical Provider, MD  Calcium-Vitamin D (CALTRATE 600 PLUS-VIT D PO) Take 2 tablets by mouth daily.    Yes Historical Provider, MD  CRESTOR 20 MG tablet Take 20 mg by mouth daily.  10/07/12  Yes Historical Provider, MD  levothyroxine (SYNTHROID, LEVOTHROID) 50 MCG tablet Take 1 tablet (50 mcg total) by mouth daily before breakfast. 10/25/12  Yes Vernie Shanks, MD  lisinopril-hydrochlorothiazide (PRINZIDE,ZESTORETIC) 10-12.5 MG per tablet Take 1 tablet by mouth daily. 10/25/12  Yes Vernie Shanks, MD  Multiple Vitamin (MULTIVITAMIN) tablet Take 1 tablet by mouth daily.     Yes Historical Provider, MD  Omega-3 Fatty Acids (OMEGA 3 PO) Take 2 g by mouth 2 (two) times daily.     Yes Historical Provider, MD  ROS: As above in the HPI. All other systems are stable or negative.  OBJECTIVE: APPEARANCE:  Patient in no acute distress.The patient appeared well nourished and normally developed. Acyanotic. Waist: VITAL SIGNS:BP 104/59  Pulse 75  Temp(Src) 97 F (36.1 C) (Oral)  Ht 4' 10.5" (1.486 m)  Wt 140 lb 12.8 oz (63.866 kg)  BMI 28.92 kg/m2 WF overweight  SKIN: warm and  Dry without overt rashes, tattoos and scars  HEAD and Neck: without JVD, Head and scalp: normal Eyes:No scleral icterus. Fundi normal, eye movements normal. Ears: Auricle normal, canal normal, Tympanic membranes normal, insufflation normal. Nose: normal Throat: normal Neck & thyroid: normal  CHEST & LUNGS: Chest  wall: normal Lungs: Clear  CVS: Reveals the PMI to be normally located. Regular rhythm, First and Second Heart sounds are normal,  absence of murmurs, rubs or gallops. Peripheral vasculature: Radial pulses: normal Dorsal pedis pulses: normal Posterior pulses: normal  ABDOMEN:  Appearance: overweight Benign, no organomegaly, no masses, no Abdominal Aortic enlargement. No Guarding , no rebound. No Bruits. Bowel sounds: normal  RECTAL: N/A GU: N/A  EXTREMETIES: nonedematous.  MUSCULOSKELETAL:  Spine: normal Joints: intact  NEUROLOGIC: oriented to time,place and person; nonfocal.  Results for orders placed in visit on 02/28/13  BMP8+EGFR      Result Value Ref Range   Glucose 88  65 - 99 mg/dL   BUN 14  8 - 27 mg/dL   Creatinine, Ser 0.87  0.57 - 1.00 mg/dL   GFR calc non Af Amer 67  >59 mL/min/1.73   GFR calc Af Amer 78  >59 mL/min/1.73   BUN/Creatinine Ratio 16  11 - 26   Sodium 135  134 - 144 mmol/L   Potassium 4.8  3.5 - 5.2 mmol/L   Chloride 96 (*) 97 - 108 mmol/L   CO2 25  18 - 29 mmol/L   Calcium 9.9  8.6 - 10.2 mg/dL    ASSESSMENT:  Obesity, unspecified  Hypertension - Plan: CMP14+EGFR, lisinopril-hydrochlorothiazide (PRINZIDE,ZESTORETIC) 10-12.5 MG per tablet, DISCONTINUED: lisinopril-hydrochlorothiazide (PRINZIDE,ZESTORETIC) 10-12.5 MG per tablet  Hyperlipidemia - Plan: CMP14+EGFR, Lipid panel  Thyroid disease - Plan: TSH, levothyroxine (SYNTHROID, LEVOTHROID) 50 MCG tablet, DISCONTINUED: levothyroxine (SYNTHROID, LEVOTHROID) 50 MCG tablet  PLAN:  Continue with a healthier diet as she has done and made progress. Medications refilled.  Orders Placed This Encounter  Procedures  . CMP14+EGFR  . Lipid panel  . TSH   Meds ordered this encounter  Medications  . DISCONTD: levothyroxine (SYNTHROID, LEVOTHROID) 50 MCG tablet    Sig: Take 1 tablet (50 mcg total) by mouth daily before breakfast.    Dispense:  90 tablet    Refill:  3  . DISCONTD:  lisinopril-hydrochlorothiazide (PRINZIDE,ZESTORETIC) 10-12.5 MG per tablet    Sig: Take 1 tablet by mouth daily.    Dispense:  90 tablet    Refill:  3  . lisinopril-hydrochlorothiazide (PRINZIDE,ZESTORETIC) 10-12.5 MG per tablet    Sig: Take 1 tablet by mouth daily.    Dispense:  90 tablet    Refill:  3  . levothyroxine (SYNTHROID, LEVOTHROID) 50 MCG tablet    Sig: Take 1 tablet (50 mcg total) by mouth daily before breakfast.    Dispense:  90 tablet    Refill:  3   Medications Discontinued During This Encounter  Medication Reason  . levothyroxine (SYNTHROID, LEVOTHROID) 50 MCG tablet Reorder  . lisinopril-hydrochlorothiazide (PRINZIDE,ZESTORETIC) 10-12.5 MG per tablet Reorder  . lisinopril-hydrochlorothiazide (PRINZIDE,ZESTORETIC) 10-12.5 MG per tablet Reorder  . levothyroxine (SYNTHROID,  LEVOTHROID) 50 MCG tablet Reorder   Return in about 4 months (around 10/28/2013) for Recheck medical problems.  Jaskiran Pata P. Jacelyn Grip, M.D.

## 2013-06-29 LAB — CMP14+EGFR
ALT: 16 IU/L (ref 0–32)
AST: 23 IU/L (ref 0–40)
Albumin/Globulin Ratio: 1.8 (ref 1.1–2.5)
Albumin: 4.6 g/dL (ref 3.5–4.8)
Alkaline Phosphatase: 96 IU/L (ref 39–117)
BUN/Creatinine Ratio: 24 (ref 11–26)
BUN: 24 mg/dL (ref 8–27)
CO2: 23 mmol/L (ref 18–29)
Calcium: 10.5 mg/dL — ABNORMAL HIGH (ref 8.7–10.3)
Chloride: 97 mmol/L (ref 97–108)
Creatinine, Ser: 0.99 mg/dL (ref 0.57–1.00)
GFR calc Af Amer: 66 mL/min/{1.73_m2} (ref >59)
GFR calc non Af Amer: 58 mL/min/{1.73_m2} — ABNORMAL LOW (ref >59)
Globulin, Total: 2.6 g/dL (ref 1.5–4.5)
Glucose: 83 mg/dL (ref 65–99)
Potassium: 4.6 mmol/L (ref 3.5–5.2)
Sodium: 140 mmol/L (ref 134–144)
Total Bilirubin: 0.3 mg/dL (ref 0.0–1.2)
Total Protein: 7.2 g/dL (ref 6.0–8.5)

## 2013-06-29 LAB — TSH: TSH: 1.58 u[IU]/mL (ref 0.450–4.500)

## 2013-06-29 LAB — LIPID PANEL
Chol/HDL Ratio: 2 ratio units (ref 0.0–4.4)
Cholesterol, Total: 138 mg/dL (ref 100–199)
HDL: 69 mg/dL (ref >39)
LDL Calculated: 56 mg/dL (ref 0–99)
Triglycerides: 65 mg/dL (ref 0–149)
VLDL Cholesterol Cal: 13 mg/dL (ref 5–40)

## 2013-08-01 ENCOUNTER — Telehealth: Payer: Self-pay | Admitting: Family Medicine

## 2013-08-01 MED ORDER — ROSUVASTATIN CALCIUM 20 MG PO TABS: 20.0000 mg | ORAL_TABLET | Freq: Every day | ORAL | Status: DC

## 2013-08-01 NOTE — Telephone Encounter (Signed)
Pt called requesting samples of crestor 20mg    Samples at front

## 2013-08-25 ENCOUNTER — Telehealth: Payer: Self-pay | Admitting: Family Medicine

## 2013-10-31 ENCOUNTER — Ambulatory Visit (INDEPENDENT_AMBULATORY_CARE_PROVIDER_SITE_OTHER): Payer: Medicare Other | Admitting: Family

## 2013-10-31 ENCOUNTER — Encounter: Payer: Self-pay | Admitting: Family

## 2013-10-31 VITALS — BP 111/57 | HR 86 | Temp 97.1°F | Ht 58.5 in | Wt 145.8 lb

## 2013-10-31 DIAGNOSIS — E079 Disorder of thyroid, unspecified: Secondary | ICD-10-CM

## 2013-10-31 DIAGNOSIS — I10 Essential (primary) hypertension: Secondary | ICD-10-CM

## 2013-10-31 DIAGNOSIS — E785 Hyperlipidemia, unspecified: Secondary | ICD-10-CM

## 2013-10-31 DIAGNOSIS — Z1321 Encounter for screening for nutritional disorder: Secondary | ICD-10-CM

## 2013-10-31 MED ORDER — LISINOPRIL-HYDROCHLOROTHIAZIDE 10-12.5 MG PO TABS: 1.0000 | ORAL_TABLET | Freq: Every day | ORAL | Status: DC

## 2013-10-31 MED ORDER — LEVOTHYROXINE SODIUM 50 MCG PO TABS: 50.0000 ug | ORAL_TABLET | Freq: Every day | ORAL | Status: DC

## 2013-10-31 MED ORDER — ROSUVASTATIN CALCIUM 20 MG PO TABS: 20.0000 mg | ORAL_TABLET | Freq: Every day | ORAL | Status: DC

## 2013-10-31 NOTE — Progress Notes (Signed)
Subjective:    Patient ID: Sherry Cooper, female    DOB: 10-05-1940, 73 y.o.   MRN: [id number]  Hypertension This is a chronic problem. The current episode started more than 1 year ago. The problem has been resolved since onset. The problem is controlled. Pertinent negatives include no anxiety, headaches, palpitations, peripheral edema or shortness of breath. Risk factors for coronary artery disease include dyslipidemia and post-menopausal state. Past treatments include ACE inhibitors and diuretics. The current treatment provides significant improvement. Hypertensive end-organ damage includes a thyroid problem. There is no history of kidney disease, CAD/MI or heart failure.  Hyperlipidemia This is a chronic problem. The current episode started more than 1 year ago. The problem is controlled. Recent lipid tests were reviewed and are normal. Exacerbating diseases include hypothyroidism. She has no history of diabetes. Pertinent negatives include no leg pain, myalgias or shortness of breath. Current antihyperlipidemic treatment includes statins. The current treatment provides significant improvement of lipids. Risk factors for coronary artery disease include dyslipidemia and hypertension.  Thyroid Problem Presents for follow-up visit. Patient reports no anxiety, cold intolerance, depressed mood, diarrhea, dry skin, hair loss, leg swelling or palpitations. The symptoms have been stable. Past treatments include levothyroxine. The treatment provided significant relief. Her past medical history is significant for hyperlipidemia. There is no history of diabetes or heart failure.      Review of Systems  Constitutional: Negative.   HENT: Negative.   Eyes: Negative.   Respiratory: Negative.  Negative for shortness of breath.   Cardiovascular: Negative.  Negative for palpitations.  Gastrointestinal: Negative.  Negative for diarrhea.  Endocrine: Negative.  Negative for cold intolerance.  Genitourinary:  Negative.   Musculoskeletal: Negative.  Negative for myalgias.  Neurological: Negative.  Negative for headaches.  Hematological: Negative.   Psychiatric/Behavioral: Negative.   All other systems reviewed and are negative.      Objective:   Physical Exam  Vitals reviewed. Constitutional: She is oriented to person, place, and time. She appears well-developed and well-nourished. No distress.  HENT:  Head: Normocephalic and atraumatic.  Right Ear: External ear normal.  Mouth/Throat: Oropharynx is clear and moist.  Eyes: Pupils are equal, round, and reactive to light.  Neck: Normal range of motion. Neck supple. No thyromegaly present.  Cardiovascular: Normal rate, regular rhythm, normal heart sounds and intact distal pulses.   No murmur heard. Pulmonary/Chest: Effort normal and breath sounds normal. No respiratory distress. She has no wheezes.  Abdominal: Soft. Bowel sounds are normal. She exhibits no distension. There is no tenderness.  Musculoskeletal: Normal range of motion. She exhibits no edema and no tenderness.  Neurological: She is alert and oriented to person, place, and time. She has normal reflexes. No cranial nerve deficit.  Skin: Skin is warm and dry.  Psychiatric: She has a normal mood and affect. Her behavior is normal. Judgment and thought content normal.    BP 111/57  Pulse 86  Temp(Src) 97.1 F (36.2 C) (Oral)  Ht 4' 10.5" (1.486 m)  Wt 145 lb 12.8 oz (66.134 kg)  BMI 29.95 kg/m2       Assessment & Plan:  1. Essential hypertension - CMP14+EGFR - lisinopril-hydrochlorothiazide (PRINZIDE,ZESTORETIC) 10-12.5 MG per tablet; Take 1 tablet by mouth daily.  Dispense: 90 tablet; Refill: 3  2. Thyroid disease - Thyroid Panel With TSH - levothyroxine (SYNTHROID, LEVOTHROID) 50 MCG tablet; Take 1 tablet (50 mcg total) by mouth daily before breakfast.  Dispense: 90 tablet; Refill: 3  3. Hyperlipidemia - Lipid panel - rosuvastatin (  CRESTOR) 20 MG tablet; Take 1  tablet (20 mg total) by mouth daily.  Dispense: 30 tablet; Refill: 6  4. Encounter for vitamin deficiency screening - Vit D  25 hydroxy (rtn osteoporosis monitoring)   Continue all meds Labs pending Health Maintenance reviewed Diet and exercise encouraged RTO 6 months  Evelina Dun, FNP

## 2013-10-31 NOTE — Patient Instructions (Signed)

## 2013-11-01 LAB — THYROID PANEL WITH TSH
Free Thyroxine Index: 2.7 (ref 1.2–4.9)
T3 Uptake Ratio: 28 % (ref 24–39)
T4 TOTAL: 9.6 ug/dL (ref 4.5–12.0)
TSH: 3.73 u[IU]/mL (ref 0.450–4.500)

## 2013-11-01 LAB — CMP14+EGFR
ALBUMIN: 4.5 g/dL (ref 3.5–4.8)
ALT: 14 IU/L (ref 0–32)
AST: 24 IU/L (ref 0–40)
Albumin/Globulin Ratio: 1.9 (ref 1.1–2.5)
Alkaline Phosphatase: 101 IU/L (ref 39–117)
BILIRUBIN TOTAL: 0.3 mg/dL (ref 0.0–1.2)
BUN / CREAT RATIO: 20 (ref 11–26)
BUN: 23 mg/dL (ref 8–27)
CO2: 24 mmol/L (ref 18–29)
CREATININE: 1.16 mg/dL — AB (ref 0.57–1.00)
Calcium: 10.1 mg/dL (ref 8.7–10.3)
Chloride: 95 mmol/L — ABNORMAL LOW (ref 97–108)
GFR, EST AFRICAN AMERICAN: 54 mL/min/{1.73_m2} — AB (ref >59)
GFR, EST NON AFRICAN AMERICAN: 47 mL/min/{1.73_m2} — AB (ref >59)
GLOBULIN, TOTAL: 2.4 g/dL (ref 1.5–4.5)
Glucose: 101 mg/dL — ABNORMAL HIGH (ref 65–99)
Potassium: 4.8 mmol/L (ref 3.5–5.2)
Sodium: 135 mmol/L (ref 134–144)
Total Protein: 6.9 g/dL (ref 6.0–8.5)

## 2013-11-01 LAB — LIPID PANEL
CHOL/HDL RATIO: 2.3 ratio (ref 0.0–4.4)
Cholesterol, Total: 145 mg/dL (ref 100–199)
HDL: 62 mg/dL (ref >39)
LDL CALC: 68 mg/dL (ref 0–99)
Triglycerides: 77 mg/dL (ref 0–149)
VLDL CHOLESTEROL CAL: 15 mg/dL (ref 5–40)

## 2013-11-01 LAB — VITAMIN D 25 HYDROXY (VIT D DEFICIENCY, FRACTURES): Vit D, 25-Hydroxy: 60.9 ng/mL (ref 30.0–100.0)

## 2013-11-03 ENCOUNTER — Telehealth: Payer: Self-pay | Admitting: Family

## 2013-11-03 NOTE — Telephone Encounter (Signed)
Done

## 2013-11-18 ENCOUNTER — Other Ambulatory Visit: Payer: Self-pay | Admitting: *Deleted

## 2013-11-18 DIAGNOSIS — E785 Hyperlipidemia, unspecified: Secondary | ICD-10-CM

## 2013-11-18 MED ORDER — ROSUVASTATIN CALCIUM 20 MG PO TABS: 20.0000 mg | ORAL_TABLET | Freq: Every day | ORAL | Status: DC

## 2013-11-22 ENCOUNTER — Other Ambulatory Visit: Payer: Self-pay | Admitting: Family Medicine

## 2013-11-22 DIAGNOSIS — Z1231 Encounter for screening mammogram for malignant neoplasm of breast: Secondary | ICD-10-CM

## 2013-12-13 ENCOUNTER — Ambulatory Visit (HOSPITAL_COMMUNITY)
Admission: RE | Admit: 2013-12-13 | Discharge: 2013-12-13 | Disposition: A | Discharge status: Home / Self Care | Payer: Medicare Other | Source: Ambulatory Visit | Attending: Family Medicine | Admitting: Family Medicine

## 2013-12-13 DIAGNOSIS — Z1231 Encounter for screening mammogram for malignant neoplasm of breast: Secondary | ICD-10-CM | POA: Insufficient documentation

## 2013-12-22 ENCOUNTER — Ambulatory Visit (INDEPENDENT_AMBULATORY_CARE_PROVIDER_SITE_OTHER): Payer: Medicare Other | Admitting: *Deleted

## 2013-12-22 DIAGNOSIS — Z23 Encounter for immunization: Secondary | ICD-10-CM

## 2013-12-22 NOTE — Progress Notes (Signed)
Patient ID: Sherry Cooper, female   DOB: [birth year], 73 y.o.   MRN: [id number] Prevnar given and tolerated well

## 2013-12-22 NOTE — Patient Instructions (Signed)
Pneumococcal Conjugate Vaccine: What You Need to Know  Your doctor recommends that you, or your child, get a dose of PCV13 today.  1. Why get vaccinated?  Pneumococcal conjugate vaccine (called PCV13 or Prevnar 13) is recommended to protect infants and toddlers, and some older children and adults with certain health conditions, from pneumococcal disease.  Pneumococcal disease is caused by infection with Streptococcus pneumoniae bacteria. These bacteria can spread from person to person through close contact.  Pneumococcal disease can lead to severe health problems, including pneumonia, blood infections, and meningitis.  Meningitis is an infection of the covering of the brain. Pneumococcal meningitis is fairly rare (less than 1 case per 100,000 people each year), but it leads to other health problems, including deafness and brain damage. In children, it is fatal in about 1 case out of 10.  Children younger than two are at higher risk for serious disease than older children.  People with certain medical conditions, people over age 65, and cigarette smokers are also at higher risk.  Before vaccine, pneumococcal infections caused many problems each year in the [place] in children younger than 5, including:  · more than 700 cases of meningitis,  · 13,000 blood infections,  · about 5 million ear infections, and  · about 200 deaths.  About 4,000 adults still die each year because of pneumococcal infections.  Pneumococcal infections can be hard to treat because some strains are resistant to antibiotics. This makes prevention through vaccination even more important.  2. PCV13 vaccine  There are more than 90 types of pneumococcal bacteria. PCV13 protects against 13 of them. These 13 strains cause most severe infections in children and about half of infections in adults.   PCV13 is routinely given to children at 2, 4, 6, and 12-15 months of age. Children in this age range are at greatest risk for serious diseases caused  by pneumococcal infection.  PCV13 vaccine may also be recommended for some older children or adults. Your doctor can give you details.  A second type of pneumococcal vaccine, called PPSV23, may also be given to some children and adults, including anyone over age 65. There is a separate Vaccine Information Statement for this vaccine.  3. Precautions   Anyone who has ever had a life-threatening allergic reaction to a dose of this vaccine, to an earlier pneumococcal vaccine called PCV7 (or Prevnar), or to any vaccine containing diphtheria toxoid (for example, DTaP), should not get PCV13.  Anyone with a severe allergy to any component of PCV13 should not get the vaccine. Tell your doctor if the person being vaccinated has any severe allergies.  If the person scheduled for vaccination is sick, your doctor might decide to reschedule the shot on another day.  Your doctor can give you more information about any of these precautions.  4. What are the risks of PCV13 vaccine?   With any medicine, including vaccines, there is a chance of side effects. These are usually mild and go away on their own, but serious reactions are also possible.  Reported problems associated with PCV13 vary by dose and age, but generally:  · About half of children became drowsy after the shot, had a temporary loss of appetite, or had redness or tenderness where the shot was given.  · About 1 out of 3 had swelling where the shot was given.  · About 1 out of 3 had a mild fever, and about 1 in 20 had a higher fever (over 102.2°F).  ·   Up to about 8 out of 10 became fussy or irritable.  Adults receiving the vaccine have reported redness, pain, and swelling where the shot was given. Mild fever, fatigue, headache, chills, or muscle pain have also been reported.  Life-threatening allergic reactions from any vaccine are very rare.  5. What if there is a serious reaction?  What should I look for?  · Look for anything that concerns you, such as signs of a  severe allergic reaction, very high fever, or behavior changes.  Signs of a severe allergic reaction can include hives, swelling of the face and throat, difficulty breathing, a fast heartbeat, dizziness, and weakness. These would start a few minutes to a few hours after the vaccination.  What should I do?  · If you think it is a severe allergic reaction or other emergency that can't wait, call 9-1-1 or get the person to the nearest hospital. Otherwise, call your doctor.  · Afterward, the reaction should be reported to the Vaccine Adverse Event Reporting System (VAERS). Your doctor might file this report, or you can do it yourself through the VAERS web site at www.vaers.hhs.gov, or by calling 1-800-822-7967.  VAERS is only for reporting reactions. They do not give medical advice.  6. The National Vaccine Injury Compensation Program  The National Vaccine Injury Compensation Program (VICP) is a federal program that was created to compensate people who may have been injured by certain vaccines.  Persons who believe they may have been injured by a vaccine can learn about the program and about filing a claim by calling 1-800-338-2382 or visiting the VICP website at www.hrsa.gov/vaccinecompensation.  7. How can I learn more?  · Ask your doctor.  · Call your local or state health department.  · Contact the Centers for Disease Control and Prevention (CDC):  ¨ Call 1-800-232-4636 (1-800-CDC-INFO) or  ¨ Visit CDC's website at www.cdc.gov/vaccines  CDC PCV13 Vaccine VIS (Interim) (06/18/11)  Document Released: 02/02/2006 Document Revised: 08/22/2013 Document Reviewed: 05/27/2013  ExitCare® Patient Information ©2015 [employer]. This information is not intended to replace advice given to you by your health care provider. Make sure you discuss any questions you have with your health care provider.

## 2014-05-08 ENCOUNTER — Ambulatory Visit: Payer: Medicare Other | Admitting: Family

## 2014-05-16 ENCOUNTER — Other Ambulatory Visit: Payer: Self-pay | Admitting: Family

## 2014-05-16 ENCOUNTER — Telehealth: Payer: Self-pay | Admitting: Family

## 2014-05-16 NOTE — Telephone Encounter (Signed)
Pt aware rx sent to the pharmacy. 

## 2014-07-07 ENCOUNTER — Ambulatory Visit (INDEPENDENT_AMBULATORY_CARE_PROVIDER_SITE_OTHER): Payer: Medicare Other

## 2014-07-07 ENCOUNTER — Ambulatory Visit (INDEPENDENT_AMBULATORY_CARE_PROVIDER_SITE_OTHER): Payer: Medicare Other | Admitting: Family Medicine

## 2014-07-07 ENCOUNTER — Encounter: Payer: Self-pay | Admitting: Family Medicine

## 2014-07-07 VITALS — BP 100/58 | HR 91 | Temp 98.5°F | Ht <= 58 in | Wt 163.0 lb

## 2014-07-07 DIAGNOSIS — Z780 Asymptomatic menopausal state: Secondary | ICD-10-CM | POA: Diagnosis not present

## 2014-07-07 DIAGNOSIS — I10 Essential (primary) hypertension: Secondary | ICD-10-CM

## 2014-07-07 DIAGNOSIS — E785 Hyperlipidemia, unspecified: Secondary | ICD-10-CM

## 2014-07-07 DIAGNOSIS — E079 Disorder of thyroid, unspecified: Secondary | ICD-10-CM

## 2014-07-07 MED ORDER — ROSUVASTATIN CALCIUM 20 MG PO TABS: 20.0000 mg | ORAL_TABLET | Freq: Every day | ORAL | Status: DC

## 2014-07-07 MED ORDER — LEVOTHYROXINE SODIUM 50 MCG PO TABS: 50.0000 ug | ORAL_TABLET | Freq: Every day | ORAL | Status: DC

## 2014-07-07 MED ORDER — LISINOPRIL-HYDROCHLOROTHIAZIDE 10-12.5 MG PO TABS: 1.0000 | ORAL_TABLET | Freq: Every day | ORAL | Status: DC

## 2014-07-07 NOTE — Progress Notes (Signed)
   Subjective:    Patient ID: Sherry Cooper, female    DOB: 11/06/1940, 74 y.o.   MRN: [id number]  HPI  74 year old female who presents today to follow-up with her hypertension and hyperlipidemia. Medications include lisinopril, Crestor, and levothyroxine. She also takes a multivitamin and accommodation vitamin D with calcium for her bones. She denies any complaints or symptoms. We spent some time talking about her weight gain which is almost 20 pounds since her last visit in July 2015. She promises to work on that  Patient Active Problem List   Diagnosis Date Noted  . Obesity, unspecified 10/25/2012  . Thyroid disease   . Hypertension   . Hyperlipidemia    Outpatient Encounter Prescriptions as of 07/07/2014  Medication Sig  . aspirin 81 MG tablet Take 81 mg by mouth daily.    . Calcium-Vitamin D (CALTRATE 600 PLUS-VIT D PO) Take 2 tablets by mouth daily.   . CRESTOR 20 MG tablet TAKE ONE TABLET BY MOUTH ONCE DAILY  . levothyroxine (SYNTHROID, LEVOTHROID) 50 MCG tablet Take 1 tablet (50 mcg total) by mouth daily before breakfast.  . lisinopril-hydrochlorothiazide (PRINZIDE,ZESTORETIC) 10-12.5 MG per tablet Take 1 tablet by mouth daily.  . Multiple Vitamin (MULTIVITAMIN) tablet Take 1 tablet by mouth daily.    . Omega-3 Fatty Acids (OMEGA 3 PO) Take 2 g by mouth 2 (two) times daily.         Review of Systems  Constitutional: Negative.   HENT: Negative.   Eyes: Negative.   Respiratory: Negative.   Cardiovascular: Negative.   Gastrointestinal: Negative.   Endocrine: Negative.   Genitourinary: Negative.   Hematological: Negative.   Psychiatric/Behavioral: Negative.        Objective:   Physical Exam  Constitutional: She is oriented to person, place, and time. She appears well-developed and well-nourished.  Eyes: Conjunctivae and EOM are normal.  Neck: Normal range of motion. Neck supple.  Cardiovascular: Normal rate, regular rhythm and normal heart sounds.   Pulmonary/Chest:  Effort normal and breath sounds normal.  Abdominal: Soft. Bowel sounds are normal.  Musculoskeletal: Normal range of motion.  Neurological: She is alert and oriented to person, place, and time. She has normal reflexes.  Skin: Skin is warm and dry.  Psychiatric: She has a normal mood and affect. Her behavior is normal. Thought content normal.    BP 100/58 mmHg  Pulse 91  Temp(Src) 98.5 F (36.9 C) (Oral)  Ht 4\' 10"  (1.473 m)  Wt 163 lb (73.936 kg)  BMI 34.08 kg/m2        Assessment & Plan:  1. Essential hypertension Blood pressure well controlled on current drug continue same - lisinopril-hydrochlorothiazide (PRINZIDE,ZESTORETIC) 10-12.5 MG per tablet; Take 1 tablet by mouth daily.  Dispense: 90 tablet; Refill: 1  2. Thyroid disease TSH was normal when last checked in July 2015 will be due again at next visit - levothyroxine (SYNTHROID, LEVOTHROID) 50 MCG tablet; Take 1 tablet (50 mcg total) by mouth daily before breakfast.  Dispense: 90 tablet; Refill: 1  3. Hyperlipidemia Lipids are at goal they may have gone up a little bit since she gained weight but will check again in July  4. Postmenopausal But showed early osteopenia. She is already taking calcium and vitamin D as noted above and I have asked her to continue same and do plenty of walking and weightbearing exercise - DG Bone Density; Future  Frederica KusterStephen M Miller MD

## 2014-11-14 ENCOUNTER — Encounter: Payer: Self-pay | Admitting: Family Medicine

## 2014-11-14 ENCOUNTER — Ambulatory Visit (INDEPENDENT_AMBULATORY_CARE_PROVIDER_SITE_OTHER): Payer: Medicare Other | Admitting: Family Medicine

## 2014-11-14 VITALS — BP 123/63 | HR 87 | Temp 98.3°F | Ht <= 58 in | Wt 162.0 lb

## 2014-11-14 DIAGNOSIS — I10 Essential (primary) hypertension: Secondary | ICD-10-CM | POA: Diagnosis not present

## 2014-11-14 DIAGNOSIS — Z780 Asymptomatic menopausal state: Secondary | ICD-10-CM

## 2014-11-14 DIAGNOSIS — E785 Hyperlipidemia, unspecified: Secondary | ICD-10-CM | POA: Diagnosis not present

## 2014-11-14 DIAGNOSIS — E079 Disorder of thyroid, unspecified: Secondary | ICD-10-CM | POA: Diagnosis not present

## 2014-11-14 NOTE — Progress Notes (Signed)
   Subjective:    Patient ID: Sherry Cooper, female    DOB: 1940-07-28, 74 y.o.   MRN: [id number]  HPI 74 year old female here to follow-up hypothyroidism, hypertension, and hyperlipidemia. She really has no complaints or symptoms today. She tolerates the statin and has no side effects. Lab work was last done one year ago so it's time to repeat lipids and liver as well as TSH. She is interested in losing some weight and we spent some time talking about dieting and uses a 6 smaller meals as opposed to 3 larger meals per day. I suggested a good weight for her might be 1:30 to 140 pounds and not the 100 pounds which would actually be more consistent with her height.  Patient Active Problem List   Diagnosis Date Noted  . Obesity, unspecified 10/25/2012  . Thyroid disease   . Hypertension   . Hyperlipidemia    Outpatient Encounter Prescriptions as of 11/14/2014  Medication Sig  . aspirin 81 MG tablet Take 81 mg by mouth daily.    . Calcium-Vitamin D (CALTRATE 600 PLUS-VIT D PO) Take 2 tablets by mouth daily.   Marland Kitchen levothyroxine (SYNTHROID, LEVOTHROID) 50 MCG tablet Take 1 tablet (50 mcg total) by mouth daily before breakfast.  . lisinopril-hydrochlorothiazide (PRINZIDE,ZESTORETIC) 10-12.5 MG per tablet Take 1 tablet by mouth daily.  . Multiple Vitamin (MULTIVITAMIN) tablet Take 1 tablet by mouth daily.    . Omega-3 Fatty Acids (OMEGA 3 PO) Take 2 g by mouth 2 (two) times daily.    . rosuvastatin (CRESTOR) 20 MG tablet Take 1 tablet (20 mg total) by mouth daily.   No facility-administered encounter medications on file as of 11/14/2014.      Review of Systems  Constitutional: Negative.   HENT: Negative.   Eyes: Negative.   Respiratory: Negative.   Cardiovascular: Negative.   Gastrointestinal: Negative.   Endocrine: Negative.   Genitourinary: Negative.   Hematological: Negative.   Psychiatric/Behavioral: Negative.        Objective:   Physical Exam  Constitutional: She is oriented to  person, place, and time. She appears well-developed and well-nourished.  Cardiovascular: Normal rate, regular rhythm and normal heart sounds.   Pulmonary/Chest: Effort normal and breath sounds normal.  Abdominal: Soft.  Musculoskeletal: Normal range of motion.  Neurological: She is alert and oriented to person, place, and time.  Psychiatric: She has a normal mood and affect.     BP 123/63 mmHg  Pulse 87  Temp(Src) 98.3 F (36.8 C) (Oral)  Ht _0  (1.473 m)  Wt 162 lb (73.483 kg)  BMI 33.87 kg/m2      Assessment & Plan:  1. Thyroid disease TSH has been stable on low-dose thyroid replacement - TSH  2. Essential hypertension Blood pressures are well controlled on combination ACE inhibitor and hydrochlorothiazide  3. Hyperlipidemia Last lipids showed LDL at goal. - CMP14+EGFR - Lipid panel  4. Post-menopausal Continue with calcium and vitamin D supplement for osteopenia. Also recommend continued activity to help keep bones stronger  Wardell Honour MD

## 2014-11-14 NOTE — Patient Instructions (Signed)
You may receive a survey either by mail or email. Please take time to complete this as it helps [place] to serve you better.  Health Maintenance Adopting a healthy lifestyle and getting preventive care can go a long way to promote health and wellness. Talk with your health care provider about what schedule of regular examinations is right for you. This is a good chance for you to check in with your provider about disease prevention and staying healthy. In between checkups, there are plenty of things you can do on your own. Experts have done a lot of research about which lifestyle changes and preventive measures are most likely to keep you healthy. Ask your health care provider for more information. WEIGHT AND DIET  Eat a healthy diet  Be sure to include plenty of vegetables, fruits, low-fat dairy products, and lean protein.  Do not eat a lot of foods high in solid fats, added sugars, or salt.  Get regular exercise. This is one of the most important things you can do for your health.  Most adults should exercise for at least 150 minutes each week. The exercise should increase your heart rate and make you sweat (moderate-intensity exercise).  Most adults should also do strengthening exercises at least twice a week. This is in addition to the moderate-intensity exercise.  Maintain a healthy weight  Body mass index (BMI) is a measurement that can be used to identify possible weight problems. It estimates body fat based on height and weight. Your health care provider can help determine your BMI and help you achieve or maintain a healthy weight.  For females 51 years of age and older:   A BMI below 18.5 is considered underweight.  A BMI of 18.5 to 24.9 is normal.  A BMI of 25 to 29.9 is considered overweight.  A BMI of 30 and above is considered obese.  Watch levels of cholesterol and blood lipids  You should start having your blood tested for lipids and cholesterol at 73 years of age, then  have this test every 5 years.  You may need to have your cholesterol levels checked more often if:  Your lipid or cholesterol levels are high.  You are older than 73 years of age.  You are at high risk for heart disease.  CANCER SCREENING   Lung Cancer  Lung cancer screening is recommended for adults 14-53 years old who are at high risk for lung cancer because of a history of smoking.  A yearly low-dose CT scan of the lungs is recommended for people who:  Currently smoke.  Have quit within the past 15 years.  Have at least a 30-pack-year history of smoking. A pack year is smoking an average of one pack of cigarettes a day for 1 year.  Yearly screening should continue until it has been 15 years since you quit.  Yearly screening should stop if you develop a health problem that would prevent you from having lung cancer treatment.  Breast Cancer  Practice breast self-awareness. This means understanding how your breasts normally appear and feel.  It also means doing regular breast self-exams. Let your health care provider know about any changes, no matter how small.  If you are in your 20s or 30s, you should have a clinical breast exam (CBE) by a health care provider every 1-3 years as part of a regular health exam.  If you are 99 or older, have a CBE every year. Also consider having a breast X-ray (mammogram)  every year.  If you have a family history of breast cancer, talk to your health care provider about genetic screening.  If you are at high risk for breast cancer, talk to your health care provider about having an MRI and a mammogram every year.  Breast cancer gene (BRCA) assessment is recommended for women who have family members with BRCA-related cancers. BRCA-related cancers include:  Breast.  Ovarian.  Tubal.  Peritoneal cancers.  Results of the assessment will determine the need for genetic counseling and BRCA1 and BRCA2 testing. Cervical Cancer Routine  pelvic examinations to screen for cervical cancer are no longer recommended for nonpregnant women who are considered low risk for cancer of the pelvic organs (ovaries, uterus, and vagina) and who do not have symptoms. A pelvic examination may be necessary if you have symptoms including those associated with pelvic infections. Ask your health care provider if a screening pelvic exam is right for you.   The Pap test is the screening test for cervical cancer for women who are considered at risk.  If you had a hysterectomy for a problem that was not cancer or a condition that could lead to cancer, then you no longer need Pap tests.  If you are older than 65 years, and you have had normal Pap tests for the past 10 years, you no longer need to have Pap tests.  If you have had past treatment for cervical cancer or a condition that could lead to cancer, you need Pap tests and screening for cancer for at least 20 years after your treatment.  If you no longer get a Pap test, assess your risk factors if they change (such as having a new sexual partner). This can affect whether you should start being screened again.  Some women have medical problems that increase their chance of getting cervical cancer. If this is the case for you, your health care provider may recommend more frequent screening and Pap tests.  The human papillomavirus (HPV) test is another test that may be used for cervical cancer screening. The HPV test looks for the virus that can cause cell changes in the cervix. The cells collected during the Pap test can be tested for HPV.  The HPV test can be used to screen women 58 years of age and older. Getting tested for HPV can extend the interval between normal Pap tests from three to five years.  An HPV test also should be used to screen women of any age who have unclear Pap test results.  After 73 years of age, women should have HPV testing as often as Pap tests.  Colorectal Cancer  This  type of cancer can be detected and often prevented.  Routine colorectal cancer screening usually begins at 73 years of age and continues through 73 years of age.  Your health care provider may recommend screening at an earlier age if you have risk factors for colon cancer.  Your health care provider may also recommend using home test kits to check for hidden blood in the stool.  A small camera at the end of a tube can be used to examine your colon directly (sigmoidoscopy or colonoscopy). This is done to check for the earliest forms of colorectal cancer.  Routine screening usually begins at age 85.  Direct examination of the colon should be repeated every 5-10 years through 73 years of age. However, you may need to be screened more often if early forms of precancerous polyps or small growths are  found. Skin Cancer  Check your skin from head to toe regularly.  Tell your health care provider about any new moles or changes in moles, especially if there is a change in a mole's shape or color.  Also tell your health care provider if you have a mole that is larger than the size of a pencil eraser.  Always use sunscreen. Apply sunscreen liberally and repeatedly throughout the day.  Protect yourself by wearing long sleeves, pants, a wide-brimmed hat, and sunglasses whenever you are outside. HEART DISEASE, DIABETES, AND HIGH BLOOD PRESSURE   Have your blood pressure checked at least every 1-2 years. High blood pressure causes heart disease and increases the risk of stroke.  If you are between 32 years and 69 years old, ask your health care provider if you should take aspirin to prevent strokes.  Have regular diabetes screenings. This involves taking a blood sample to check your fasting blood sugar level.  If you are at a normal weight and have a low risk for diabetes, have this test once every three years after 73 years of age.  If you are overweight and have a high risk for diabetes,  consider being tested at a younger age or more often. PREVENTING INFECTION  Hepatitis B  If you have a higher risk for hepatitis B, you should be screened for this virus. You are considered at high risk for hepatitis B if:  You were born in a country where hepatitis B is common. Ask your health care provider which countries are considered high risk.  Your parents were born in a high-risk country, and you have not been immunized against hepatitis B (hepatitis B vaccine).  You have HIV or AIDS.  You use needles to inject street drugs.  You live with someone who has hepatitis B.  You have had sex with someone who has hepatitis B.  You get hemodialysis treatment.  You take certain medicines for conditions, including cancer, organ transplantation, and autoimmune conditions. Hepatitis C  Blood testing is recommended for:  Everyone born from [birth year] through 1964.  Anyone with known risk factors for hepatitis C. Sexually transmitted infections (STIs)  You should be screened for sexually transmitted infections (STIs) including gonorrhea and chlamydia if:  You are sexually active and are younger than 73 years of age.  You are older than 73 years of age and your health care provider tells you that you are at risk for this type of infection.  Your sexual activity has changed since you were last screened and you are at an increased risk for chlamydia or gonorrhea. Ask your health care provider if you are at risk.  If you do not have HIV, but are at risk, it may be recommended that you take a prescription medicine daily to prevent HIV infection. This is called pre-exposure prophylaxis (PrEP). You are considered at risk if:  You are sexually active and do not regularly use condoms or know the HIV status of your partner(s).  You take drugs by injection.  You are sexually active with a partner who has HIV. Talk with your health care provider about whether you are at high risk of being  infected with HIV. If you choose to begin PrEP, you should first be tested for HIV. You should then be tested every 3 months for as long as you are taking PrEP.  PREGNANCY   If you are premenopausal and you may become pregnant, ask your health care provider about preconception counseling.  If  you may become pregnant, take 400 to 800 micrograms (mcg) of folic acid every day.  If you want to prevent pregnancy, talk to your health care provider about birth control (contraception). OSTEOPOROSIS AND MENOPAUSE   Osteoporosis is a disease in which the bones lose minerals and strength with aging. This can result in serious bone fractures. Your risk for osteoporosis can be identified using a bone density scan.  If you are 100 years of age or older, or if you are at risk for osteoporosis and fractures, ask your health care provider if you should be screened.  Ask your health care provider whether you should take a calcium or vitamin D supplement to lower your risk for osteoporosis.  Menopause may have certain physical symptoms and risks.  Hormone replacement therapy may reduce some of these symptoms and risks. Talk to your health care provider about whether hormone replacement therapy is right for you.  HOME CARE INSTRUCTIONS   Schedule regular health, dental, and eye exams.  Stay current with your immunizations.   Do not use any tobacco products including cigarettes, chewing tobacco, or electronic cigarettes.  If you are pregnant, do not drink alcohol.  If you are breastfeeding, limit how much and how often you drink alcohol.  Limit alcohol intake to no more than 1 drink per day for nonpregnant women. One drink equals 12 ounces of beer, 5 ounces of wine, or 1 ounces of hard liquor.  Do not use street drugs.  Do not share needles.  Ask your health care provider for help if you need support or information about quitting drugs.  Tell your health care provider if you often feel  depressed.  Tell your health care provider if you have ever been abused or do not feel safe at home. Document Released: 10/21/2010 Document Revised: 08/22/2013 Document Reviewed: 03/09/2013 [facility] Patient Information 2015 [place], [place]. This information is not intended to replace advice given to you by your health care provider. Make sure you discuss any questions you have with your health care provider.

## 2014-11-15 LAB — LIPID PANEL
CHOLESTEROL TOTAL: 138 mg/dL (ref 100–199)
Chol/HDL Ratio: 2 ratio units (ref 0.0–4.4)
HDL: 69 mg/dL (ref >39)
LDL Calculated: 53 mg/dL (ref 0–99)
Triglycerides: 82 mg/dL (ref 0–149)
VLDL CHOLESTEROL CAL: 16 mg/dL (ref 5–40)

## 2014-11-15 LAB — TSH: TSH: 3.18 u[IU]/mL (ref 0.450–4.500)

## 2014-11-15 LAB — CMP14+EGFR
A/G RATIO: 1.8 (ref 1.1–2.5)
ALT: 18 IU/L (ref 0–32)
AST: 26 IU/L (ref 0–40)
Albumin: 4.4 g/dL (ref 3.5–4.8)
Alkaline Phosphatase: 108 IU/L (ref 39–117)
BILIRUBIN TOTAL: 0.4 mg/dL (ref 0.0–1.2)
BUN / CREAT RATIO: 20 (ref 11–26)
BUN: 25 mg/dL (ref 8–27)
CO2: 20 mmol/L (ref 18–29)
Calcium: 10 mg/dL (ref 8.7–10.3)
Chloride: 93 mmol/L — ABNORMAL LOW (ref 97–108)
Creatinine, Ser: 1.26 mg/dL — ABNORMAL HIGH (ref 0.57–1.00)
GFR calc Af Amer: 49 mL/min/{1.73_m2} — ABNORMAL LOW (ref >59)
GFR calc non Af Amer: 42 mL/min/{1.73_m2} — ABNORMAL LOW (ref >59)
Globulin, Total: 2.5 g/dL (ref 1.5–4.5)
Glucose: 98 mg/dL (ref 65–99)
POTASSIUM: 4.5 mmol/L (ref 3.5–5.2)
Sodium: 134 mmol/L (ref 134–144)
TOTAL PROTEIN: 6.9 g/dL (ref 6.0–8.5)

## 2014-11-21 ENCOUNTER — Other Ambulatory Visit: Payer: Self-pay | Admitting: Family Medicine

## 2014-11-21 DIAGNOSIS — Z1231 Encounter for screening mammogram for malignant neoplasm of breast: Secondary | ICD-10-CM

## 2014-12-19 ENCOUNTER — Ambulatory Visit (HOSPITAL_COMMUNITY)
Admission: RE | Admit: 2014-12-19 | Discharge: 2014-12-19 | Disposition: A | Discharge status: Home / Self Care | Payer: Medicare Other | Source: Ambulatory Visit | Attending: Family Medicine | Admitting: Family Medicine

## 2014-12-19 ENCOUNTER — Other Ambulatory Visit: Payer: Self-pay | Admitting: Family Medicine

## 2014-12-19 DIAGNOSIS — Z1231 Encounter for screening mammogram for malignant neoplasm of breast: Secondary | ICD-10-CM | POA: Insufficient documentation

## 2014-12-28 ENCOUNTER — Encounter: Payer: Self-pay | Admitting: *Deleted

## 2015-01-01 ENCOUNTER — Other Ambulatory Visit: Payer: Self-pay | Admitting: Family Medicine

## 2015-03-08 ENCOUNTER — Telehealth: Payer: Self-pay | Admitting: Family Medicine

## 2015-03-08 NOTE — Telephone Encounter (Signed)
[facility]

## 2015-04-05 ENCOUNTER — Other Ambulatory Visit: Payer: Self-pay | Admitting: Family Medicine

## 2015-05-21 ENCOUNTER — Ambulatory Visit: Payer: Medicare Other | Admitting: Family Medicine

## 2015-05-24 ENCOUNTER — Ambulatory Visit (INDEPENDENT_AMBULATORY_CARE_PROVIDER_SITE_OTHER): Payer: Medicare Other | Admitting: Family Medicine

## 2015-05-24 ENCOUNTER — Encounter: Payer: Self-pay | Admitting: Family Medicine

## 2015-05-24 VITALS — BP 109/62 | HR 97 | Temp 97.4°F | Ht <= 58 in | Wt 165.0 lb

## 2015-05-24 DIAGNOSIS — I10 Essential (primary) hypertension: Secondary | ICD-10-CM

## 2015-05-24 DIAGNOSIS — E079 Disorder of thyroid, unspecified: Secondary | ICD-10-CM

## 2015-05-24 DIAGNOSIS — E785 Hyperlipidemia, unspecified: Secondary | ICD-10-CM | POA: Diagnosis not present

## 2015-05-24 NOTE — Progress Notes (Signed)
   Subjective:    Patient ID: Sherry Cooper, female    DOB: [birth year], 75 y.o.   MRN: [id number]  HPI Pt here for follow up and management of chronic medical problems which includes hyperlipidemia, hypothyroid, and hypertension. She is taking medications regularly. She has no specific concerns or complaints today. When we last checked her lipids look for really good with a low LDL and high HDL and low triglycerides. She does try to watch her diet. Blood pressure continues to be good on lisinopril hydrochlorothiazide. She denies any hip or knee pain or back pain.      Patient Active Problem List   Diagnosis Date Noted  . Obesity, unspecified 10/25/2012  . Thyroid disease   . Hypertension   . Hyperlipidemia    Outpatient Encounter Prescriptions as of 05/24/2015  Medication Sig  . aspirin 81 MG tablet Take 81 mg by mouth daily.    . Calcium-Vitamin D (CALTRATE 600 PLUS-VIT D PO) Take 2 tablets by mouth daily.   . CRESTOR 20 MG tablet TAKE ONE TABLET BY MOUTH ONCE DAILY  . levothyroxine (SYNTHROID, LEVOTHROID) 50 MCG tablet TAKE ONE TABLET BY MOUTH ONCE DAILY BEFORE BREAKFAST  . lisinopril-hydrochlorothiazide (PRINZIDE,ZESTORETIC) 10-12.5 MG tablet TAKE ONE TABLET BY MOUTH ONCE DAILY  . Multiple Vitamin (MULTIVITAMIN) tablet Take 1 tablet by mouth daily.    . Omega-3 Fatty Acids (OMEGA 3 PO) Take 2 g by mouth 2 (two) times daily.     No facility-administered encounter medications on file as of 05/24/2015.      Review of Systems  Constitutional: Negative.   HENT: Negative.   Eyes: Negative.   Respiratory: Negative.   Cardiovascular: Negative.   Gastrointestinal: Negative.   Endocrine: Negative.   Genitourinary: Negative.   Musculoskeletal: Negative.   Skin: Negative.   Allergic/Immunologic: Negative.   Neurological: Negative.   Hematological: Negative.   Psychiatric/Behavioral: Negative.        Objective:   Physical Exam  Constitutional: She is oriented to person, place,  and time. She appears well-developed and well-nourished.  Cardiovascular: Normal rate, regular rhythm and normal heart sounds.   Pulmonary/Chest: Effort normal and breath sounds normal.  Musculoskeletal: Normal range of motion.  Neurological: She is alert and oriented to person, place, and time.  Psychiatric: She has a normal mood and affect. Her behavior is normal.   BP 109/62 mmHg  Pulse 97  Temp(Src) 97.4 F (36.3 C) (Oral)  Ht _0  (1.473 m)  Wt 165 lb (74.844 kg)  BMI 34.49 kg/m2        Assessment & Plan:  1. Thyroid disease TSH done in July was in the therapeutic range. Would expect same today - TSH  2. Essential hypertension Esters well controlled today but pressure is 109/62. 6 months ago was 123/63. Her GFR is 42 so she does have some chronic kidney disease should be noted - CMP14+EGFR  3. Hyperlipidemia Lipids were excellent 6 months ago. Her slight weight gain should not have influenced that much. - Lipid panel  Wardell Honour MD

## 2015-05-25 LAB — CMP14+EGFR
ALBUMIN: 4.3 g/dL (ref 3.5–4.8)
ALT: 18 IU/L (ref 0–32)
AST: 32 IU/L (ref 0–40)
Albumin/Globulin Ratio: 1.6 (ref 1.1–2.5)
Alkaline Phosphatase: 117 IU/L (ref 39–117)
BUN / CREAT RATIO: 16 (ref 11–26)
BUN: 18 mg/dL (ref 8–27)
Bilirubin Total: 0.3 mg/dL (ref 0.0–1.2)
CALCIUM: 10 mg/dL (ref 8.7–10.3)
CO2: 23 mmol/L (ref 18–29)
CREATININE: 1.16 mg/dL — AB (ref 0.57–1.00)
Chloride: 95 mmol/L — ABNORMAL LOW (ref 96–106)
GFR, EST AFRICAN AMERICAN: 54 mL/min/{1.73_m2} — AB (ref >59)
GFR, EST NON AFRICAN AMERICAN: 47 mL/min/{1.73_m2} — AB (ref >59)
GLUCOSE: 84 mg/dL (ref 65–99)
Globulin, Total: 2.7 g/dL (ref 1.5–4.5)
Potassium: 4.4 mmol/L (ref 3.5–5.2)
Sodium: 136 mmol/L (ref 134–144)
TOTAL PROTEIN: 7 g/dL (ref 6.0–8.5)

## 2015-05-25 LAB — LIPID PANEL
CHOLESTEROL TOTAL: 142 mg/dL (ref 100–199)
Chol/HDL Ratio: 2.3 ratio units (ref 0.0–4.4)
HDL: 63 mg/dL (ref >39)
LDL CALC: 61 mg/dL (ref 0–99)
Triglycerides: 92 mg/dL (ref 0–149)
VLDL CHOLESTEROL CAL: 18 mg/dL (ref 5–40)

## 2015-05-25 LAB — TSH: TSH: 3.77 u[IU]/mL (ref 0.450–4.500)

## 2015-07-05 ENCOUNTER — Other Ambulatory Visit: Payer: Self-pay | Admitting: Family Medicine

## 2015-07-09 ENCOUNTER — Telehealth: Payer: Self-pay | Admitting: Family Medicine

## 2015-07-09 NOTE — Telephone Encounter (Signed)
Generic Crestor is fine at the same dose.

## 2015-07-11 ENCOUNTER — Telehealth: Payer: Self-pay | Admitting: Family Medicine

## 2015-07-11 MED ORDER — ROSUVASTATIN CALCIUM 20 MG PO TABS: 20.0000 mg | ORAL_TABLET | Freq: Every day | ORAL | Status: DC

## 2015-07-11 NOTE — Telephone Encounter (Signed)
rx sent to the pharmacy and pt is aware. 

## 2015-07-12 NOTE — Telephone Encounter (Signed)
Message taken care of 

## 2015-10-02 ENCOUNTER — Other Ambulatory Visit: Payer: Self-pay | Admitting: Family Medicine

## 2015-11-21 ENCOUNTER — Other Ambulatory Visit: Payer: Self-pay | Admitting: Family Medicine

## 2015-11-21 DIAGNOSIS — Z1231 Encounter for screening mammogram for malignant neoplasm of breast: Secondary | ICD-10-CM

## 2015-11-22 ENCOUNTER — Ambulatory Visit (INDEPENDENT_AMBULATORY_CARE_PROVIDER_SITE_OTHER): Payer: Medicare Other | Admitting: Family Medicine

## 2015-11-22 ENCOUNTER — Encounter: Payer: Self-pay | Admitting: Family Medicine

## 2015-11-22 VITALS — BP 101/62 | HR 93 | Temp 97.1°F | Ht <= 58 in | Wt 165.0 lb

## 2015-11-22 DIAGNOSIS — E079 Disorder of thyroid, unspecified: Secondary | ICD-10-CM

## 2015-11-22 DIAGNOSIS — E785 Hyperlipidemia, unspecified: Secondary | ICD-10-CM

## 2015-11-22 DIAGNOSIS — I10 Essential (primary) hypertension: Secondary | ICD-10-CM

## 2015-11-22 NOTE — Progress Notes (Signed)
   Subjective:    Patient ID: Sherry Cooper, female    DOB: 29-Dec-1940, 75 y.o.   MRN: [id number]  HPI Pt here for follow up and management of chronic medical problems which includes thyroid disease, hypertension and hyperlipidemia.  She has no real complaints today. Meds for blood pressure lipids and thyroid control those chronic problems. Weight is stable. Blood pressure is good     Patient Active Problem List   Diagnosis Date Noted  . Obesity, unspecified 10/25/2012  . Thyroid disease   . Hypertension   . Hyperlipidemia    Outpatient Encounter Prescriptions as of 11/22/2015  Medication Sig  . aspirin 81 MG tablet Take 81 mg by mouth daily.    . Calcium-Vitamin D (CALTRATE 600 PLUS-VIT D PO) Take 2 tablets by mouth daily.   Marland Kitchen levothyroxine (SYNTHROID, LEVOTHROID) 50 MCG tablet TAKE ONE TABLET BY MOUTH ONCE DAILY BEFORE BREAKFAST  . lisinopril-hydrochlorothiazide (PRINZIDE,ZESTORETIC) 10-12.5 MG tablet TAKE ONE TABLET BY MOUTH ONCE DAILY  . Multiple Vitamin (MULTIVITAMIN) tablet Take 1 tablet by mouth daily.    . Omega-3 Fatty Acids (OMEGA 3 PO) Take 2 g by mouth 2 (two) times daily.    . rosuvastatin (CRESTOR) 20 MG tablet Take 1 tablet (20 mg total) by mouth daily.   No facility-administered encounter medications on file as of 11/22/2015.      Review of Systems  Constitutional: Negative.   HENT: Negative.   Eyes: Negative.   Respiratory: Negative.   Cardiovascular: Negative.   Gastrointestinal: Negative.   Endocrine: Negative.   Genitourinary: Negative.   Musculoskeletal: Negative.   Skin: Negative.   Allergic/Immunologic: Negative.   Neurological: Negative.   Hematological: Negative.   Psychiatric/Behavioral: Negative.        Objective:   Physical Exam  Constitutional: She is oriented to person, place, and time. She appears well-developed and well-nourished.  HENT:  Head: Normocephalic and atraumatic.  Eyes: Pupils are equal, round, and reactive to light.    Cardiovascular: Normal rate, regular rhythm and normal heart sounds.   Pulmonary/Chest: Effort normal and breath sounds normal.  Musculoskeletal: Normal range of motion. She exhibits no edema.  Neurological: She is alert and oriented to person, place, and time.  Psychiatric: She has a normal mood and affect. Her behavior is normal.   BP 101/62 (BP Location: Left Arm)   Pulse 93   Temp 97.1 F (36.2 C) (Oral)   Ht '4\' 10"'$  (1.473 m)   Wt 165 lb (74.8 kg)   BMI 34.49 kg/m         Assessment & Plan:  1. Essential hypertension Blood pressure well controlled on lisinopril and hydrochlorothiazide. Continue same - CMP14+EGFR  2. Hyperlipidemia Lipids were at goal 6 months ago we'll recheck today - Lipid panel  3. Thyroid disease TSH is therapeutic. Continue same dose thyroid  Wardell Honour MD

## 2015-11-22 NOTE — Patient Instructions (Signed)
Medicare Annual Wellness Visit  [place] and the medical providers at [facility] strive to bring you the best medical care.  In doing so we not only want to address your current medical conditions and concerns but also to detect new conditions early and prevent illness, disease and health-related problems.    Medicare offers a yearly Wellness Visit which allows our clinical staff to assess your need for preventative services including immunizations, lifestyle education, counseling to decrease risk of preventable diseases and screening for fall risk and other medical concerns.    This visit is provided free of charge (no copay) for all Medicare recipients. The clinical pharmacists at [facility] have begun to conduct these Wellness Visits which will also include a thorough review of all your medications.    As you primary medical provider recommend that you make an appointment for your Annual Wellness Visit if you have not done so already this year.  You may set up this appointment before you leave today or you may call back ([phone]) and schedule an appointment.  Please make sure when you call that you mention that you are scheduling your Annual Wellness Visit with the clinical pharmacist so that the appointment may be made for the proper length of time.     Continue current medications. Continue good therapeutic lifestyle changes which include good diet and exercise. Fall precautions discussed with patient. If an FOBT was given today- please return it to our front desk. If you are over 50 years old - you may need Prevnar 13 or the adult Pneumonia vaccine.  **Flu shots are available--- please call and schedule a FLU-CLINIC appointment**  After your visit with us today you will receive a survey in the mail or online from [employer] regarding your care with us. Please take a moment to fill this out. Your feedback is very  important to us as you can help us better understand your patient needs as well as improve your experience and satisfaction. WE CARE ABOUT YOU!!!    

## 2015-11-23 ENCOUNTER — Telehealth: Payer: Self-pay | Admitting: Family Medicine

## 2015-11-23 LAB — CMP14+EGFR
A/G RATIO: 1.5 (ref 1.2–2.2)
ALK PHOS: 118 IU/L — AB (ref 39–117)
ALT: 18 IU/L (ref 0–32)
AST: 25 IU/L (ref 0–40)
Albumin: 4.2 g/dL (ref 3.5–4.8)
BILIRUBIN TOTAL: 0.4 mg/dL (ref 0.0–1.2)
BUN / CREAT RATIO: 14 (ref 12–28)
BUN: 16 mg/dL (ref 8–27)
CHLORIDE: 97 mmol/L (ref 96–106)
CO2: 25 mmol/L (ref 18–29)
Calcium: 10.1 mg/dL (ref 8.7–10.3)
Creatinine, Ser: 1.14 mg/dL — ABNORMAL HIGH (ref 0.57–1.00)
GFR calc non Af Amer: 47 mL/min/{1.73_m2} — ABNORMAL LOW (ref >59)
GFR, EST AFRICAN AMERICAN: 55 mL/min/{1.73_m2} — AB (ref >59)
GLOBULIN, TOTAL: 2.8 g/dL (ref 1.5–4.5)
Glucose: 97 mg/dL (ref 65–99)
POTASSIUM: 4.6 mmol/L (ref 3.5–5.2)
SODIUM: 138 mmol/L (ref 134–144)
Total Protein: 7 g/dL (ref 6.0–8.5)

## 2015-11-23 LAB — LIPID PANEL
CHOLESTEROL TOTAL: 146 mg/dL (ref 100–199)
Chol/HDL Ratio: 2.3 ratio units (ref 0.0–4.4)
HDL: 63 mg/dL (ref >39)
LDL Calculated: 64 mg/dL (ref 0–99)
Triglycerides: 93 mg/dL (ref 0–149)
VLDL Cholesterol Cal: 19 mg/dL (ref 5–40)

## 2015-11-23 NOTE — Telephone Encounter (Signed)
Pt aware of test results

## 2015-12-25 ENCOUNTER — Ambulatory Visit
Admission: RE | Admit: 2015-12-25 | Discharge: 2015-12-25 | Disposition: A | Discharge status: Home / Self Care | Payer: Medicare Other | Source: Ambulatory Visit | Attending: Family Medicine | Admitting: Family Medicine

## 2015-12-25 DIAGNOSIS — Z1231 Encounter for screening mammogram for malignant neoplasm of breast: Secondary | ICD-10-CM

## 2016-01-03 ENCOUNTER — Other Ambulatory Visit: Payer: Self-pay | Admitting: Family Medicine

## 2016-01-10 DIAGNOSIS — Z961 Presence of intraocular lens: Secondary | ICD-10-CM | POA: Diagnosis not present

## 2016-01-10 DIAGNOSIS — H40013 Open angle with borderline findings, low risk, bilateral: Secondary | ICD-10-CM | POA: Diagnosis not present

## 2016-01-22 DIAGNOSIS — H1132 Conjunctival hemorrhage, left eye: Secondary | ICD-10-CM | POA: Diagnosis not present

## 2016-02-01 ENCOUNTER — Ambulatory Visit (INDEPENDENT_AMBULATORY_CARE_PROVIDER_SITE_OTHER): Payer: Medicare Other

## 2016-02-01 DIAGNOSIS — Z23 Encounter for immunization: Secondary | ICD-10-CM | POA: Diagnosis not present

## 2016-05-27 ENCOUNTER — Encounter: Payer: Self-pay | Admitting: Family Medicine

## 2016-05-27 ENCOUNTER — Ambulatory Visit (INDEPENDENT_AMBULATORY_CARE_PROVIDER_SITE_OTHER): Payer: Medicare Other | Admitting: Family Medicine

## 2016-05-27 VITALS — BP 103/66 | HR 85 | Temp 97.1°F | Ht <= 58 in | Wt 161.0 lb

## 2016-05-27 DIAGNOSIS — E7800 Pure hypercholesterolemia, unspecified: Secondary | ICD-10-CM

## 2016-05-27 DIAGNOSIS — I10 Essential (primary) hypertension: Secondary | ICD-10-CM | POA: Diagnosis not present

## 2016-05-27 DIAGNOSIS — E079 Disorder of thyroid, unspecified: Secondary | ICD-10-CM | POA: Diagnosis not present

## 2016-05-27 NOTE — Progress Notes (Signed)
   Subjective:    Patient ID: Sherry Cooper, female    DOB: 04/08/1941, 76 y.o.   MRN: [id number]  HPI Pt here for follow up and management of chronic medical problems which includes hyperlipidemia and hypertension. She is taking medication regularly. No new complaints or concerns today. She is tolerating her medicines. Weight is well managed and she has lost 4 pounds in the last 6 months. Blood pressures are excellent at 103/66 today.    Patient Active Problem List   Diagnosis Date Noted  . Obesity, unspecified 10/25/2012  . Thyroid disease   . Hypertension   . Hyperlipidemia    Outpatient Encounter Prescriptions as of 05/27/2016  Medication Sig  . aspirin 81 MG tablet Take 81 mg by mouth daily.    . Calcium-Vitamin D (CALTRATE 600 PLUS-VIT D PO) Take 2 tablets by mouth daily.   Marland Kitchen. levothyroxine (SYNTHROID, LEVOTHROID) 50 MCG tablet TAKE ONE TABLET BY MOUTH ONCE DAILY BEFORE BREAKFAST  . lisinopril-hydrochlorothiazide (PRINZIDE,ZESTORETIC) 10-12.5 MG tablet TAKE ONE TABLET BY MOUTH ONCE DAILY  . Multiple Vitamin (MULTIVITAMIN) tablet Take 1 tablet by mouth daily.    . Omega-3 Fatty Acids (OMEGA 3 PO) Take 2 g by mouth 2 (two) times daily.    . rosuvastatin (CRESTOR) 20 MG tablet TAKE ONE TABLET BY MOUTH ONCE DAILY   No facility-administered encounter medications on file as of 05/27/2016.       Review of Systems  Constitutional: Negative.   HENT: Negative.   Eyes: Negative.   Respiratory: Negative.   Cardiovascular: Negative.   Gastrointestinal: Negative.   Endocrine: Negative.   Genitourinary: Negative.   Musculoskeletal: Negative.   Skin: Negative.   Allergic/Immunologic: Negative.   Neurological: Negative.   Hematological: Negative.   Psychiatric/Behavioral: Negative.        Objective:   Physical Exam  Constitutional: She appears well-developed and well-nourished.  Neck:  I believe there is a bruit on left side and to a lesser extent on the right. The pulses are  palpable MG is asymptomatic and blood pressure and lipids are well controlled and she takes aspirin. I have reassured her that there is nothing more to do at this point  Cardiovascular: Normal rate, regular rhythm and normal heart sounds.    BP 103/66 (BP Location: Left Arm)   Pulse 85   Temp 97.1 F (36.2 C) (Oral)   Ht 4\' 10"  (1.473 m)   Wt 161 lb (73 kg)   BMI 33.65 kg/m         Assessment & Plan:  1. Essential hypertension Blood pressure well controlled. Continue on lisinopril hydrochlorothiazide  2. Pure hypercholesterolemia LDL cholesterol is 64 and an HDL of 63. I recommended repeat lipids at one-year anniversary in 6 months  3. Thyroid disease TSH was normal last February and she has continued on same dose of levothyroxine - TSH  Frederica KusterStephen M Miller MD

## 2016-05-27 NOTE — Patient Instructions (Signed)
Medicare Annual Wellness Visit  [place] and the medical providers at [facility] strive to bring you the best medical care.  In doing so we not only want to address your current medical conditions and concerns but also to detect new conditions early and prevent illness, disease and health-related problems.    Medicare offers a yearly Wellness Visit which allows our clinical staff to assess your need for preventative services including immunizations, lifestyle education, counseling to decrease risk of preventable diseases and screening for fall risk and other medical concerns.    This visit is provided free of charge (no copay) for all Medicare recipients. The clinical pharmacists at [facility] have begun to conduct these Wellness Visits which will also include a thorough review of all your medications.    As you primary medical provider recommend that you make an appointment for your Annual Wellness Visit if you have not done so already this year.  You may set up this appointment before you leave today or you may call back ([phone]) and schedule an appointment.  Please make sure when you call that you mention that you are scheduling your Annual Wellness Visit with the clinical pharmacist so that the appointment may be made for the proper length of time.     Continue current medications. Continue good therapeutic lifestyle changes which include good diet and exercise. Fall precautions discussed with patient. If an FOBT was given today- please return it to our front desk. If you are over 50 years old - you may need Prevnar 13 or the adult Pneumonia vaccine.  **Flu shots are available--- please call and schedule a FLU-CLINIC appointment**  After your visit with us today you will receive a survey in the mail or online from [employer] regarding your care with us. Please take a moment to fill this out. Your feedback is very  important to us as you can help us better understand your patient needs as well as improve your experience and satisfaction. WE CARE ABOUT YOU!!!    

## 2016-05-28 LAB — TSH: TSH: 3.01 u[IU]/mL (ref 0.450–4.500)

## 2016-06-26 ENCOUNTER — Other Ambulatory Visit: Payer: Self-pay | Admitting: Family Medicine

## 2016-10-01 ENCOUNTER — Other Ambulatory Visit: Payer: Self-pay | Admitting: Family Medicine

## 2016-11-18 ENCOUNTER — Other Ambulatory Visit: Payer: Self-pay | Admitting: Family Medicine

## 2016-11-18 DIAGNOSIS — Z1231 Encounter for screening mammogram for malignant neoplasm of breast: Secondary | ICD-10-CM

## 2016-11-25 ENCOUNTER — Ambulatory Visit (INDEPENDENT_AMBULATORY_CARE_PROVIDER_SITE_OTHER): Payer: Medicare Other | Admitting: Family Medicine

## 2016-11-25 ENCOUNTER — Encounter: Payer: Self-pay | Admitting: Family Medicine

## 2016-11-25 VITALS — BP 115/61 | HR 97 | Temp 97.0°F | Ht <= 58 in | Wt 161.6 lb

## 2016-11-25 DIAGNOSIS — E079 Disorder of thyroid, unspecified: Secondary | ICD-10-CM | POA: Diagnosis not present

## 2016-11-25 DIAGNOSIS — E7800 Pure hypercholesterolemia, unspecified: Secondary | ICD-10-CM | POA: Diagnosis not present

## 2016-11-25 DIAGNOSIS — G8929 Other chronic pain: Secondary | ICD-10-CM

## 2016-11-25 DIAGNOSIS — I10 Essential (primary) hypertension: Secondary | ICD-10-CM | POA: Diagnosis not present

## 2016-11-25 DIAGNOSIS — M25562 Pain in left knee: Secondary | ICD-10-CM

## 2016-11-25 DIAGNOSIS — M25561 Pain in right knee: Secondary | ICD-10-CM | POA: Diagnosis not present

## 2016-11-25 NOTE — Progress Notes (Signed)
   HPI  Patient presents today here to establish care, her previous PCP has retired, to discuss hypertension and hyperlipidemia, thyroid disease, bilateral knee pain.  Bilateral knee pain Has been going on for about 4 months Describes aching throbbing type pain after exercise. She states that she was previously push mowing her own yard, her kids of recently taken over this and her knee pain has improved. She denies any injury or history of or current knee pain. She has had a Baker's cyst on the left which has resolved.  Patient is able to walk around for 2 hours or so shopping without much trouble, she does describe knee discomfort and tiredness at this time.  Hypertension Good medication compliance No chest pain or headaches.  Hyperlipidemia Good Crestor compliance  Thyroid disease Asymptomatic Good medication compliance.  PMH: Smoking status noted ROS: Per HPI  Objective: BP 115/61   Pulse 97   Temp (!) 97 F (36.1 C) (Oral)   Ht '4\' 10"'$  (1.473 m)   Wt 161 lb 9.6 oz (73.3 kg)   BMI 33.77 kg/m  Gen: NAD, alert, cooperative with exam HEENT: NCAT CV: RRR, good S1/S2, no murmur Resp: CTABL, no wheezes, non-labored Ext: No edema, warm Neuro: Alert and oriented, No gross deficits MSK: BL knee without gross deformity + crepitus BL No joint line tenderness.  ligamentously intact to Lachman's and with varus and valgus stress.  Negative McMurray's test   Assessment and plan:  # HTN Controlled, no changes labs  # H:LD Previously controlled, no changes labs  # Thyroid disease Good crestor compliance labs  # BL knee pain Likely deconditioning and some mild osteoarthritis Recommended quadriceps exercises, knee sleeves if needed, Tylenol, ice if needed Symptoms are mild to moderate, no intervention needed at this time, consider x-ray if worsening    Orders Placed This Encounter  Procedures  . CBC with Differential/Platelet  . CMP14+EGFR  . Lipid panel  .  TSH     Laroy Apple, MD [facility] 11/25/2016, 8:44 AM

## 2016-11-25 NOTE — Patient Instructions (Signed)
Great to see you!  Come back in 6 months unless you need us sooner.   For your knees: Consider a knee sleeve to wear with activity Consider tylenol as needed, ice for 15 min is also good.  Consider the exercises  Come back with any worsening

## 2016-11-26 ENCOUNTER — Encounter: Payer: Self-pay | Admitting: Family Medicine

## 2016-11-26 LAB — CBC WITH DIFFERENTIAL/PLATELET
BASOS ABS: 0 10*3/uL (ref 0.0–0.2)
Basos: 0 %
EOS (ABSOLUTE): 0.1 10*3/uL (ref 0.0–0.4)
Eos: 1 %
HEMOGLOBIN: 12.3 g/dL (ref 11.1–15.9)
Hematocrit: 37.7 % (ref 34.0–46.6)
IMMATURE GRANS (ABS): 0 10*3/uL (ref 0.0–0.1)
Immature Granulocytes: 0 %
LYMPHS: 25 %
Lymphocytes Absolute: 1.7 10*3/uL (ref 0.7–3.1)
MCH: 28.8 pg (ref 26.6–33.0)
MCHC: 32.6 g/dL (ref 31.5–35.7)
MCV: 88 fL (ref 79–97)
MONOCYTES: 12 %
Monocytes Absolute: 0.8 10*3/uL (ref 0.1–0.9)
NEUTROS PCT: 62 %
Neutrophils Absolute: 4.1 10*3/uL (ref 1.4–7.0)
PLATELETS: 270 10*3/uL (ref 150–379)
RBC: 4.27 x10E6/uL (ref 3.77–5.28)
RDW: 14.1 % (ref 12.3–15.4)
WBC: 6.7 10*3/uL (ref 3.4–10.8)

## 2016-11-26 LAB — CMP14+EGFR
ALBUMIN: 4.6 g/dL (ref 3.5–4.8)
ALK PHOS: 113 IU/L (ref 39–117)
ALT: 15 IU/L (ref 0–32)
AST: 27 IU/L (ref 0–40)
Albumin/Globulin Ratio: 1.7 (ref 1.2–2.2)
BILIRUBIN TOTAL: 0.3 mg/dL (ref 0.0–1.2)
BUN / CREAT RATIO: 22 (ref 12–28)
BUN: 28 mg/dL — AB (ref 8–27)
CHLORIDE: 99 mmol/L (ref 96–106)
CO2: 20 mmol/L (ref 20–29)
CREATININE: 1.25 mg/dL — AB (ref 0.57–1.00)
Calcium: 10.2 mg/dL (ref 8.7–10.3)
GFR calc Af Amer: 49 mL/min/{1.73_m2} — ABNORMAL LOW (ref >59)
GFR calc non Af Amer: 42 mL/min/{1.73_m2} — ABNORMAL LOW (ref >59)
GLUCOSE: 95 mg/dL (ref 65–99)
Globulin, Total: 2.7 g/dL (ref 1.5–4.5)
Potassium: 5 mmol/L (ref 3.5–5.2)
Sodium: 137 mmol/L (ref 134–144)
TOTAL PROTEIN: 7.3 g/dL (ref 6.0–8.5)

## 2016-11-26 LAB — LIPID PANEL
CHOLESTEROL TOTAL: 129 mg/dL (ref 100–199)
Chol/HDL Ratio: 2.1 ratio (ref 0.0–4.4)
HDL: 62 mg/dL (ref >39)
LDL CALC: 53 mg/dL (ref 0–99)
Triglycerides: 70 mg/dL (ref 0–149)
VLDL CHOLESTEROL CAL: 14 mg/dL (ref 5–40)

## 2016-11-26 LAB — TSH: TSH: 3.62 u[IU]/mL (ref 0.450–4.500)

## 2016-12-24 ENCOUNTER — Other Ambulatory Visit: Payer: Self-pay | Admitting: Family Medicine

## 2016-12-30 ENCOUNTER — Ambulatory Visit
Admission: RE | Admit: 2016-12-30 | Discharge: 2016-12-30 | Disposition: A | Discharge status: Home / Self Care | Payer: Medicare Other | Source: Ambulatory Visit | Attending: Family Medicine | Admitting: Family Medicine

## 2016-12-30 DIAGNOSIS — Z1231 Encounter for screening mammogram for malignant neoplasm of breast: Secondary | ICD-10-CM

## 2017-01-14 DIAGNOSIS — Z961 Presence of intraocular lens: Secondary | ICD-10-CM | POA: Diagnosis not present

## 2017-01-14 DIAGNOSIS — H40013 Open angle with borderline findings, low risk, bilateral: Secondary | ICD-10-CM | POA: Diagnosis not present

## 2017-02-10 ENCOUNTER — Ambulatory Visit (INDEPENDENT_AMBULATORY_CARE_PROVIDER_SITE_OTHER): Payer: Medicare Other

## 2017-02-10 DIAGNOSIS — Z23 Encounter for immunization: Secondary | ICD-10-CM

## 2017-05-28 ENCOUNTER — Encounter: Payer: Self-pay | Admitting: Family Medicine

## 2017-05-28 ENCOUNTER — Ambulatory Visit: Payer: Medicare Other | Admitting: Family Medicine

## 2017-05-28 VITALS — BP 113/60 | HR 84 | Temp 97.4°F | Ht <= 58 in | Wt 160.2 lb

## 2017-05-28 DIAGNOSIS — I10 Essential (primary) hypertension: Secondary | ICD-10-CM

## 2017-05-28 DIAGNOSIS — R7309 Other abnormal glucose: Secondary | ICD-10-CM

## 2017-05-28 DIAGNOSIS — E7800 Pure hypercholesterolemia, unspecified: Secondary | ICD-10-CM

## 2017-05-28 LAB — BAYER DCA HB A1C WAIVED: HB A1C: 6.1 % (ref <7.0)

## 2017-05-28 NOTE — Patient Instructions (Signed)
Great to see you!  I think you would do well with any of our providers but you would be especially good with Dr. Oswaldo DoneVincent or Prudy FeelerAngel Jones.

## 2017-05-28 NOTE — Progress Notes (Signed)
   HPI  Patient presents today here to follow-up for chronic medical conditions.  Patient feels well and has no complaints.  She reports very good medication compliance with Prinzide and Crestor.  She also tolerates them well without side effects.  She does not check her blood pressure at home. She denies headache or chest pain.  We discussed A1c of 5.9 in July 2014, prediabetes. She was not aware of this  PMH: Smoking status noted ROS: Per HPI  Objective: BP 113/60   Pulse 84   Temp (!) 97.4 F (36.3 C) (Oral)   Ht 4\' 10"  (1.473 m)   Wt 160 lb 3.2 oz (72.7 kg)   BMI 33.48 kg/m  Gen: NAD, alert, cooperative with exam HEENT: NCAT CV: RRR, good S1/S2, no murmur Resp: CTABL, no wheezes, non-labored Ext: No edema, warm Neuro: Alert and oriented, No gross deficits  Assessment and plan:  #Prediabetes, elevated A1c New diagnosis based on labs from 2014 No other signs of uncontrolled blood sugars   #Hypertension Well-controlled No changes to Prinzide Labs up-to-date  #Hyperlipidemia Previous cholesterol very well controlled, continue statin Tolerates well  Orders Placed This Encounter  Procedures  . [facility] Hb A1c Ellender HoseWaived     Sam Bradshaw, MD [facility] 05/28/2017, 8:35 AM

## 2017-06-02 ENCOUNTER — Ambulatory Visit (INDEPENDENT_AMBULATORY_CARE_PROVIDER_SITE_OTHER): Payer: Medicare Other | Admitting: *Deleted

## 2017-06-02 ENCOUNTER — Ambulatory Visit (INDEPENDENT_AMBULATORY_CARE_PROVIDER_SITE_OTHER): Payer: Medicare Other

## 2017-06-02 ENCOUNTER — Other Ambulatory Visit: Payer: Self-pay | Admitting: Family Medicine

## 2017-06-02 ENCOUNTER — Encounter: Payer: Self-pay | Admitting: *Deleted

## 2017-06-02 ENCOUNTER — Other Ambulatory Visit: Payer: Self-pay

## 2017-06-02 VITALS — BP 113/68 | HR 78 | Temp 97.3°F | Ht <= 58 in | Wt 163.0 lb

## 2017-06-02 DIAGNOSIS — M8580 Other specified disorders of bone density and structure, unspecified site: Secondary | ICD-10-CM

## 2017-06-02 DIAGNOSIS — M8588 Other specified disorders of bone density and structure, other site: Secondary | ICD-10-CM | POA: Diagnosis not present

## 2017-06-02 DIAGNOSIS — Z780 Asymptomatic menopausal state: Secondary | ICD-10-CM | POA: Diagnosis not present

## 2017-06-02 DIAGNOSIS — Z0000 Encounter for general adult medical examination without abnormal findings: Secondary | ICD-10-CM | POA: Diagnosis not present

## 2017-06-02 DIAGNOSIS — M85852 Other specified disorders of bone density and structure, left thigh: Secondary | ICD-10-CM | POA: Diagnosis not present

## 2017-06-02 NOTE — Progress Notes (Signed)
Subjective:   Azlin Udovich is a 77 y.o. female who presents for an Initial Medicare Annual Wellness Visit.     Ms. Locklin reports she has been widowed for 19 years and has been retired for 13 years from [employer] in [place]. She is alert and oriented and answers all questions appropriately.  She lives alone and has 1 son and 1 daughter. She attends [employer] in [place]. She reports she has been in good health and her health is unchanged from last year. She is a  patient of Dr. Autumn Messing and has made her follow up appointment in August with Prudy Feeler when Dr. Ermalinda Memos leaves. She has no complaints at this visit but would like some information on a low fat cholesterol diet. She does have to go up and down her basement steps because her washing machine and dryer are downstairs in the basement.We discussed fall precautions/prevention.          Objective:    Today's Vitals   06/02/17 0906 06/02/17 0920  BP: 113/68   Pulse: 78   Temp: (!) 97.3 F (36.3 C)   TempSrc: Oral   Weight: 163 lb (73.9 kg)   Height: 4\' 10"  (1.473 m)   PainSc: 0-No pain 0-No pain   Body mass index is 34.07 kg/m.  Advanced Directives 06/02/2017  Does Patient Have a Medical Advance Directive? No  Would patient like information on creating a medical advance directive? Yes (MAU/Ambulatory/Procedural Areas - Information given)    Current Medications (verified) Outpatient Encounter Medications as of 06/02/2017  Medication Sig  . aspirin 81 MG tablet Take 81 mg by mouth daily.    . Calcium-Vitamin D (CALTRATE 600 PLUS-VIT D PO) Take 1 tablet by mouth daily.   Marland Kitchen levothyroxine (SYNTHROID, LEVOTHROID) 50 MCG tablet TAKE 1 TABLET BY MOUTH ONCE DAILY BEFORE BREAKFAST  . lisinopril-hydrochlorothiazide (PRINZIDE,ZESTORETIC) 10-12.5 MG tablet TAKE 1 TABLET BY MOUTH ONCE DAILY (MUST BE SEEN FOR REFILLS)  . Multiple Vitamin (MULTIVITAMIN) tablet Take 1 tablet by mouth daily.    . rosuvastatin (CRESTOR)  20 MG tablet TAKE 1 TABLET BY MOUTH ONCE DAILY (MUST BE SEEN BEFORE NEXT REFILL)  . Omega-3 Fatty Acids (OMEGA 3 PO) Take 2 g by mouth 2 (two) times daily.     No facility-administered encounter medications on file as of 06/02/2017.     Allergies (verified) Patient has no known allergies.   History: Past Medical History:  Diagnosis Date  . Arthritis   . Cataract    Bilateral  Surgery on both cant remember dates   . Hyperlipidemia   . Hypertension   . Thyroid disease    Past Surgical History:  Procedure Laterality Date  . ABDOMINAL HYSTERECTOMY  1987  . CHOLECYSTECTOMY  05/31/2002   laparoscopic  . DILATION AND CURETTAGE OF UTERUS  1987   Family History  Problem Relation Age of Onset  . Cancer Sister        uterine and breast and lung  . Diabetes Brother   . Diabetes Sister   . Congestive Heart Failure Father   . COPD Brother   . Colon cancer Neg Hx   . Stomach cancer Neg Hx    Social History   Socioeconomic History  . Marital status: Widowed    Spouse name: None  . Number of children: 2  . Years of education: 54  . Highest education level: 12th grade  Social Needs  . Financial resource strain: Not hard at  all  . Food insecurity - worry: Never true  . Food insecurity - inability: Never true  . Transportation needs - medical: No  . Transportation needs - non-medical: No  Occupational History  . Occupation: Retired     Comment: [employer]   Tobacco Use  . Smoking status: Never Smoker  . Smokeless tobacco: Never Used  Substance and Sexual Activity  . Alcohol use: No    Frequency: Never  . Drug use: No  . Sexual activity: Not Currently  Other Topics Concern  . None  Social History Narrative  . None    Tobacco Counseling Never smoked  Clinical Intake:    Pain : No/denies pain Pain Score: 0-No pain    Nutritional Risks: None Diabetes: No  How often do you need to have someone help you when you read instructions, pamphlets, or other  written materials from your doctor or pharmacy?: 1 - Never What is the last grade level you completed in school?: 12 grade      Information entered by :: Flonnie OvermanJill Stovall RN    Activities of Daily Living In your present state of health, do you have any difficulty performing the following activities: 06/02/2017  Hearing? N  Vision? N  Difficulty concentrating or making decisions? N  Walking or climbing stairs? N  Dressing or bathing? N  Doing errands, shopping? N  Some recent data might be hidden     Immunizations and Health Maintenance Immunization History  Administered Date(s) Administered  . Influenza, High Dose Seasonal PF 02/01/2016, 02/10/2017  . Influenza,inj,Quad PF,6+ Mos 01/24/2013  . Influenza-Unspecified 02/01/2014, 03/06/2015  . Pneumococcal Conjugate-13 12/22/2013  . Pneumococcal-Unspecified 11/20/2010  . Tdap 07/21/2006  . Zoster 04/21/2006   Patient is interested in receiving the shingrix vaccine but we are out today. I will call her when it comes in this week.  Patient Care Team: Elenora GammaBradshaw, Samuel L, MD as PCP - General (Family Medicine) Patient will change her PCP to Prudy FeelerAngel Jones PA when Dr. Candie EchevariaBradshaw leaves   Indicate any recent Medical Services you may have received from other than [facility] providers in the past year (date may be approximate).     Assessment:   This is a routine wellness examination for Tovah.  Hearing/Vision screen No hearing or vision deficits noted. Patient sees Dr. Dorthey [place]Groate [place] exams. Records release signed to obtain her last office visit. She has a visit scheduled September 2019.  Dietary issues and exercise activities discussed:  Low Cholesterol diet and DASH diet handouts given. Patient will call if she has any questions. Seated exercises handout given and discussed to try to perform them at least 3 times a week and increase if tolerated.   Goals    None     Depression Screen PHQ 2/9 Scores 06/02/2017 05/28/2017  11/25/2016 05/27/2016 11/22/2015 05/24/2015 11/14/2014  PHQ - 2 Score 0 0 0 0 0 0 0    Fall Risk Fall Risk  06/02/2017 05/28/2017 11/25/2016 05/27/2016 11/22/2015  Falls in the past year? No No No No No    Is the patient's home free of loose throw rugs in walkways, pet beds, electrical cords, etc?   no      Grab bars in the bathroom? no      Handrails on the stairs?   yes      Adequate lighting?   yes  Cognitive Function: MMSE - Mini Mental State Exam 06/02/2017  Orientation to time 5  Orientation to Place 5  Registration 3  Attention/ Calculation 5  Recall 3  Language- name 2 objects 2  Language- repeat 1  Language- follow 3 step command 3  Language- read & follow direction 1  Write a sentence 1  Copy design 1  Total score 30        Screening Tests Health Maintenance  Topic Date Due  . TETANUS/TDAP  04/21/2020  . COLONOSCOPY  02/24/2021  . INFLUENZA VACCINE  Completed  . DEXA SCAN  Completed  . PNA vac Low Risk Adult  Completed    Qualifies for Shingles Vaccine? She is interested in the shingles vaccine. Our office is out and I will call her when it comes in this week.   Cancer Screenings: Lung: Low Dose CT Chest recommended if Age 13-80 years, 30 pack-year currently smoking OR have quit w/in 15years. Patient does not qualify. Breast: Up to date on Mammogram? Yes   Up to date of Bone Density/Dexa? Yes Colorectal: last colonoscopy 2012 not due until 2022    Plan:  Patient will return this week for shingrix vaccine. Dexascan performed today in the office-no changes since 2016. 6 month follow up appointment made with Prudy Feeler [facility] for 11/25/17 @ 8:25 am. Information given on a low cholesterol/low fat diet and dash diet. Patient will call me if she has any questions. Discussed fall prevention and precautions. Seated exercises handout given with a goal of 3 times a week.   I have personally reviewed and noted the following in the patient's chart:   . Medical and social history . Use  of alcohol, tobacco or illicit drugs  . Current medications and supplements . Functional ability and status . Nutritional status . Physical activity . Advanced directives . List of other physicians . Hospitalizations, surgeries, and ER visits in previous 12 months . Vitals . Screenings to include cognitive, depression, and falls . Referrals and appointments  In addition, I have reviewed and discussed with patient certain preventive protocols, quality metrics, and best practice recommendations. A written personalized care plan for preventive services as well as general preventive health recommendations were provided to patient.     Henrietta Dine, RN   06/02/2017    I have reviewed and agree with the above AWV documentation.   Murtis Sink, MD [facility] 06/04/2017, 10:19 AM

## 2017-06-02 NOTE — Patient Instructions (Signed)
[name] , Thank you for taking time to come for your Medicare Wellness Visit. I appreciate your ongoing commitment to your health goals. Please review the following plan we discussed and let me know if I can assist you in the future.     This is a list of the screening recommended for you and due dates:  Health Maintenance  Topic Date Due  . Tetanus Vaccine  04/21/2020  . Colon Cancer Screening  02/24/2021  . Flu Shot  Completed  . DEXA scan (bone density measurement)  Completed  . Pneumonia vaccines  Completed   Fat and Cholesterol Restricted Diet Getting too much fat and cholesterol in your diet may cause health problems. Following this diet helps keep your fat and cholesterol at normal levels. This can keep you from getting sick. What types of fat should I choose?  Choose monosaturated and polyunsaturated fats. These are found in foods such as olive oil, canola oil, flaxseeds, walnuts, almonds, and seeds.  Eat more omega-3 fats. Good choices include salmon, mackerel, sardines, tuna, flaxseed oil, and ground flaxseeds.  Limit saturated fats. These are in animal products such as meats, butter, and cream. They can also be in plant products such as palm oil, palm kernel oil, and coconut oil.  Avoid foods with partially hydrogenated oils in them. These contain trans fats. Examples of foods that have trans fats are stick margarine, some tub margarines, cookies, crackers, and other baked goods. What general guidelines do I need to follow?  Check food labels. Look for the words "trans fat" and "saturated fat."  When preparing a meal: ? Fill half of your plate with vegetables and green salads. ? Fill one fourth of your plate with whole grains. Look for the word "whole" as the first word in the ingredient list. ? Fill one fourth of your plate with lean protein foods.  Eat more foods that have fiber, like apples, carrots, beans, peas, and barley.  Eat more home-cooked foods. Eat less  at restaurants and buffets.  Limit or avoid alcohol.  Limit foods high in starch and sugar.  Limit fried foods.  Cook foods without frying them. Baking, boiling, grilling, and broiling are all great options.  Lose weight if you are overweight. Losing even a small amount of weight can help your overall health. It can also help prevent diseases such as diabetes and heart disease. What foods can I eat? Grains Whole grains, such as whole wheat or whole grain breads, crackers, cereals, and pasta. Unsweetened oatmeal, bulgur, barley, quinoa, or brown rice. Corn or whole wheat flour tortillas. Vegetables Fresh or frozen vegetables (raw, steamed, roasted, or grilled). Green salads. Fruits All fresh, canned (in natural juice), or frozen fruits. Meat and Other Protein Products Ground beef (85% or leaner), grass-fed beef, or beef trimmed of fat. Skinless chicken or [place]. Ground chicken or [place]. Pork trimmed of fat. All fish and seafood. Eggs. Dried beans, peas, or lentils. Unsalted nuts or seeds. Unsalted canned or dry beans. Dairy Low-fat dairy products, such as skim or 1% milk, 2% or reduced-fat cheeses, low-fat ricotta or cottage cheese, or plain low-fat yogurt. Fats and Oils Tub margarines without trans fats. Light or reduced-fat mayonnaise and salad dressings. Avocado. Olive, canola, sesame, or safflower oils. Natural peanut or almond butter (choose ones without added sugar and oil). The items listed above may not be a complete list of recommended foods or beverages. Contact your dietitian for more options. What foods are not recommended? Grains White bread.  White pasta. White rice. Cornbread. Bagels, pastries, and croissants. Crackers that contain trans fat. Vegetables White potatoes. Corn. Creamed or fried vegetables. Vegetables in a cheese sauce. Fruits Dried fruits. Canned fruit in light or heavy syrup. Fruit juice. Meat and Other Protein Products Fatty cuts of meat. Ribs, chicken  wings, bacon, sausage, bologna, salami, chitterlings, fatback, hot dogs, bratwurst, and packaged luncheon meats. Liver and organ meats. Dairy Whole or 2% milk, cream, half-and-half, and cream cheese. Whole milk cheeses. Whole-fat or sweetened yogurt. Full-fat cheeses. Nondairy creamers and whipped toppings. Processed cheese, cheese spreads, or cheese curds. Sweets and Desserts Corn syrup, sugars, honey, and molasses. Candy. Jam and jelly. Syrup. Sweetened cereals. Cookies, pies, cakes, donuts, muffins, and ice cream. Fats and Oils Butter, stick margarine, lard, shortening, ghee, or bacon fat. Coconut, palm kernel, or palm oils. Beverages Alcohol. Sweetened drinks (such as sodas, lemonade, and fruit drinks or punches). The items listed above may not be a complete list of foods and beverages to avoid. Contact your dietitian for more information. This information is not intended to replace advice given to you by your health care provider. Make sure you discuss any questions you have with your health care provider. Document Released: 10/07/2011 Document Revised: 12/13/2015 Document Reviewed: 07/07/2013 Elsevier Interactive Patient Education  2018 [employer]  Cholesterol Cholesterol is a fat. Your body needs a small amount of cholesterol. Cholesterol (plaque) may build up in your blood vessels (arteries). That makes you more likely to have a heart attack or stroke. You cannot feel your cholesterol level. Having a blood test is the only way to find out if your level is high. Keep your test results. Work with your doctor to keep your cholesterol at a good level. What do the results mean?  Total cholesterol is how much cholesterol is in your blood.  LDL is bad cholesterol. This is the type that can build up. Try to have low LDL.  HDL is good cholesterol. It cleans your blood vessels and carries LDL away. Try to have high HDL.  Triglycerides are fat that the body can store or burn for  energy. What are good levels of cholesterol?  Total cholesterol below 200.  LDL below 100 is good for people who have health risks. LDL below 70 is good for people who have very high risks.  HDL above 40 is good. It is best to have HDL of 60 or higher.  Triglycerides below 150. How can I lower my cholesterol? Diet Follow your diet program as told by your doctor.  Choose fish, white meat chicken, or [place] that is roasted or baked. Try not to eat red meat, fried foods, sausage, or lunch meats.  Eat lots of fresh fruits and vegetables.  Choose whole grains, beans, pasta, potatoes, and cereals.  Choose olive oil, corn oil, or canola oil. Only use small amounts.  Try not to eat butter, mayonnaise, shortening, or palm kernel oils.  Try not to eat foods with trans fats.  Choose low-fat or nonfat dairy foods. ? Drink skim or nonfat milk. ? Eat low-fat or nonfat yogurt and cheeses. ? Try not to drink whole milk or cream. ? Try not to eat ice cream, egg yolks, or full-fat cheeses.  Healthy desserts include angel food cake, ginger snaps, animal crackers, hard candy, popsicles, and low-fat or nonfat frozen yogurt. Try not to eat pastries, cakes, pies, and cookies.  Exercise Follow your exercise program as told by your doctor.  Be more active. Try gardening, walking,  and taking the stairs.  Ask your doctor about ways that you can be more active.  Medicine  Take over-the-counter and prescription medicines only as told by your doctor. This information is not intended to replace advice given to you by your health care provider. Make sure you discuss any questions you have with your health care provider. Document Released: 07/04/2008 Document Revised: 11/07/2015 Document Reviewed: 10/18/2015 Elsevier Interactive Patient Education  [employer].

## 2017-06-23 ENCOUNTER — Other Ambulatory Visit: Payer: Self-pay | Admitting: Family Medicine

## 2017-09-08 ENCOUNTER — Ambulatory Visit: Payer: Medicare Other

## 2017-09-09 ENCOUNTER — Ambulatory Visit (INDEPENDENT_AMBULATORY_CARE_PROVIDER_SITE_OTHER): Payer: Medicare Other | Admitting: *Deleted

## 2017-09-09 DIAGNOSIS — Z23 Encounter for immunization: Secondary | ICD-10-CM

## 2017-09-09 NOTE — Progress Notes (Signed)
shingrix vaccine given Pt tolerated well 

## 2017-09-22 ENCOUNTER — Other Ambulatory Visit: Payer: Self-pay | Admitting: Family Medicine

## 2017-10-26 ENCOUNTER — Telehealth: Payer: Self-pay | Admitting: Family Medicine

## 2017-10-26 NOTE — Telephone Encounter (Signed)
Pt meant shingrix, informed her after 11/09/17

## 2017-11-25 ENCOUNTER — Ambulatory Visit: Payer: Medicare Other | Admitting: Physician Assistant

## 2017-11-25 ENCOUNTER — Encounter: Payer: Self-pay | Admitting: Physician Assistant

## 2017-11-25 VITALS — BP 103/65 | HR 90 | Temp 97.3°F | Ht <= 58 in | Wt 154.2 lb

## 2017-11-25 DIAGNOSIS — E7800 Pure hypercholesterolemia, unspecified: Secondary | ICD-10-CM

## 2017-11-25 DIAGNOSIS — E079 Disorder of thyroid, unspecified: Secondary | ICD-10-CM | POA: Diagnosis not present

## 2017-11-25 DIAGNOSIS — Z23 Encounter for immunization: Secondary | ICD-10-CM

## 2017-11-25 DIAGNOSIS — R7309 Other abnormal glucose: Secondary | ICD-10-CM | POA: Diagnosis not present

## 2017-11-25 DIAGNOSIS — I10 Essential (primary) hypertension: Secondary | ICD-10-CM

## 2017-11-25 MED ORDER — ROSUVASTATIN CALCIUM 20 MG PO TABS: 20.0000 mg | ORAL_TABLET | Freq: Every day | ORAL | 3 refills | Status: DC

## 2017-11-25 MED ORDER — LISINOPRIL-HYDROCHLOROTHIAZIDE 10-12.5 MG PO TABS: 1.0000 | ORAL_TABLET | Freq: Every day | ORAL | 3 refills | Status: DC

## 2017-11-25 MED ORDER — LEVOTHYROXINE SODIUM 50 MCG PO TABS: ORAL_TABLET | ORAL | 3 refills | Status: DC

## 2017-11-25 NOTE — Patient Instructions (Signed)
Hypothyroidism Hypothyroidism is a disorder of the thyroid. The thyroid is a large gland that is located in the lower front of the neck. The thyroid releases hormones that control how the body works. With hypothyroidism, the thyroid does not make enough of these hormones. What are the causes? Causes of hypothyroidism may include:  Viral infections.  Pregnancy.  Your own defense system (immune system) attacking your thyroid.  Certain medicines.  Birth defects.  Past radiation treatments to your head or neck.  Past treatment with radioactive iodine.  Past surgical removal of part or all of your thyroid.  Problems with the gland that is located in the center of your brain (pituitary).  What are the signs or symptoms? Signs and symptoms of hypothyroidism may include:  Feeling as though you have no energy (lethargy).  Inability to tolerate cold.  Weight gain that is not explained by a change in diet or exercise habits.  Dry skin.  Coarse hair.  Menstrual irregularity.  Slowing of thought processes.  Constipation.  Sadness or depression.  How is this diagnosed? Your health care provider may diagnose hypothyroidism with blood tests and ultrasound tests. How is this treated? Hypothyroidism is treated with medicine that replaces the hormones that your body does not make. After you begin treatment, it may take several weeks for symptoms to go away. Follow these instructions at home:  Take medicines only as directed by your health care provider.  If you start taking any new medicines, tell your health care provider.  Keep all follow-up visits as directed by your health care provider. This is important. As your condition improves, your dosage needs may change. You will need to have blood tests regularly so that your health care provider can watch your condition. Contact a health care provider if:  Your symptoms do not get better with treatment.  You are taking thyroid  replacement medicine and: ? You sweat excessively. ? You have tremors. ? You feel anxious. ? You lose weight rapidly. ? You cannot tolerate heat. ? You have emotional swings. ? You have diarrhea. ? You feel weak. Get help right away if:  You develop chest pain.  You develop an irregular heartbeat.  You develop a rapid heartbeat. This information is not intended to replace advice given to you by your health care provider. Make sure you discuss any questions you have with your health care provider. Document Released: 04/07/2005 Document Revised: 09/13/2015 Document Reviewed: 08/23/2013 Elsevier Interactive Patient Education  2018 Elsevier Inc.  

## 2017-11-26 LAB — CBC WITH DIFFERENTIAL/PLATELET
BASOS ABS: 0 10*3/uL (ref 0.0–0.2)
Basos: 1 %
EOS (ABSOLUTE): 0.1 10*3/uL (ref 0.0–0.4)
Eos: 2 %
Hematocrit: 35.6 % (ref 34.0–46.6)
Hemoglobin: 12.2 g/dL (ref 11.1–15.9)
IMMATURE GRANS (ABS): 0 10*3/uL (ref 0.0–0.1)
Immature Granulocytes: 0 %
LYMPHS ABS: 1.3 10*3/uL (ref 0.7–3.1)
LYMPHS: 24 %
MCH: 30.3 pg (ref 26.6–33.0)
MCHC: 34.3 g/dL (ref 31.5–35.7)
MCV: 88 fL (ref 79–97)
MONOCYTES: 15 %
Monocytes Absolute: 0.8 10*3/uL (ref 0.1–0.9)
NEUTROS ABS: 3.2 10*3/uL (ref 1.4–7.0)
Neutrophils: 58 %
PLATELETS: 268 10*3/uL (ref 150–450)
RBC: 4.03 x10E6/uL (ref 3.77–5.28)
RDW: 14.3 % (ref 12.3–15.4)
WBC: 5.4 10*3/uL (ref 3.4–10.8)

## 2017-11-26 LAB — CMP14+EGFR
ALK PHOS: 94 IU/L (ref 39–117)
ALT: 14 IU/L (ref 0–32)
AST: 21 IU/L (ref 0–40)
Albumin/Globulin Ratio: 1.6 (ref 1.2–2.2)
Albumin: 4.3 g/dL (ref 3.5–4.8)
BILIRUBIN TOTAL: 0.3 mg/dL (ref 0.0–1.2)
BUN/Creatinine Ratio: 15 (ref 12–28)
BUN: 17 mg/dL (ref 8–27)
CHLORIDE: 94 mmol/L — AB (ref 96–106)
CO2: 22 mmol/L (ref 20–29)
CREATININE: 1.13 mg/dL — AB (ref 0.57–1.00)
Calcium: 10.1 mg/dL (ref 8.7–10.3)
GFR calc Af Amer: 55 mL/min/{1.73_m2} — ABNORMAL LOW (ref >59)
GFR calc non Af Amer: 47 mL/min/{1.73_m2} — ABNORMAL LOW (ref >59)
GLUCOSE: 96 mg/dL (ref 65–99)
Globulin, Total: 2.7 g/dL (ref 1.5–4.5)
Potassium: 4.7 mmol/L (ref 3.5–5.2)
Sodium: 132 mmol/L — ABNORMAL LOW (ref 134–144)
Total Protein: 7 g/dL (ref 6.0–8.5)

## 2017-11-26 LAB — LIPID PANEL
CHOLESTEROL TOTAL: 136 mg/dL (ref 100–199)
Chol/HDL Ratio: 2.2 ratio (ref 0.0–4.4)
HDL: 62 mg/dL (ref >39)
LDL Calculated: 59 mg/dL (ref 0–99)
TRIGLYCERIDES: 73 mg/dL (ref 0–149)
VLDL CHOLESTEROL CAL: 15 mg/dL (ref 5–40)

## 2017-11-26 LAB — TSH: TSH: 2.85 u[IU]/mL (ref 0.450–4.500)

## 2017-11-26 LAB — HEMOGLOBIN A1C
Est. average glucose Bld gHb Est-mCnc: 131 mg/dL
Hgb A1c MFr Bld: 6.2 % — ABNORMAL HIGH (ref 4.8–5.6)

## 2017-11-26 NOTE — Progress Notes (Signed)
BP 103/65   Pulse 90   Temp (!) 97.3 F (36.3 C) (Oral)   Ht _0  (1.473 m)   Wt 154 lb 3.2 oz (69.9 kg)   BMI 32.23 kg/m    Subjective:    Patient ID: Sherry Cooper, female    DOB: October 03, 1940, 77 y.o.   MRN: [id number]  HPI: Sherry Cooper is a 77 y.o. female presenting on 11/25/2017 for Hyperlipidemia (6 month follow up ) and Hypertension  This patient comes in for periodic recheck on medications and conditions including hypertension, hyperlipidemia, hypothyroidism.  She did have an elevated A1c and we are going to check this.  Is just her six-month check and she reports that she is doing very well overall.  Some refills need to be sent..   All medications are reviewed today. There are no reports of any problems with the medications. All of the medical conditions are reviewed and updated.  Lab work is reviewed and will be ordered as medically necessary. There are no new problems reported with today's visit.   Past Medical History:  Diagnosis Date  . Arthritis   . Cataract    Bilateral  Surgery on both cant remember dates   . Hyperlipidemia   . Hypertension   . Thyroid disease    Relevant past medical, surgical, family and social history reviewed and updated as indicated. Interim medical history since our last visit reviewed. Allergies and medications reviewed and updated. DATA REVIEWED: CHART IN EPIC  Family History reviewed for pertinent findings.  Review of Systems  Constitutional: Negative.   HENT: Negative.   Eyes: Negative.   Respiratory: Negative.   Gastrointestinal: Negative.   Genitourinary: Negative.     Allergies as of 11/25/2017   No Known Allergies     Medication List        Accurate as of 11/25/17 11:59 PM. Always use your most recent med list.          aspirin 81 MG tablet Take 81 mg by mouth daily.   CALTRATE 600 PLUS-VIT D PO Take 1 tablet by mouth daily.   levothyroxine 50 MCG tablet Commonly known as:  SYNTHROID, LEVOTHROID TAKE 1 TABLET  BY MOUTH ONCE DAILY BEFORE BREAKFAST   lisinopril-hydrochlorothiazide 10-12.5 MG tablet Commonly known as:  PRINZIDE,ZESTORETIC Take 1 tablet by mouth daily.   multivitamin tablet Take 1 tablet by mouth daily.   OMEGA 3 PO Take 2 g by mouth 2 (two) times daily.   rosuvastatin 20 MG tablet Commonly known as:  CRESTOR Take 1 tablet (20 mg total) by mouth daily.          Objective:    BP 103/65   Pulse 90   Temp (!) 97.3 F (36.3 C) (Oral)   Ht _1  (1.473 m)   Wt 154 lb 3.2 oz (69.9 kg)   BMI 32.23 kg/m   No Known Allergies  Wt Readings from Last 3 Encounters:  11/25/17 154 lb 3.2 oz (69.9 kg)  06/02/17 163 lb (73.9 kg)  05/28/17 160 lb 3.2 oz (72.7 kg)    Physical Exam  Constitutional: She is oriented to person, place, and time. She appears well-developed and well-nourished.  HENT:  Head: Normocephalic and atraumatic.  Eyes: Pupils are equal, round, and reactive to light. Conjunctivae and EOM are normal.  Cardiovascular: Normal rate, regular rhythm, normal heart sounds and intact distal pulses.  Pulmonary/Chest: Effort normal and breath sounds normal.  Abdominal: Soft. Bowel sounds are normal.  Neurological: She is  alert and oriented to person, place, and time. She has normal reflexes.  Skin: Skin is warm and dry. No rash noted.  Psychiatric: She has a normal mood and affect. Her behavior is normal. Judgment and thought content normal.    Results for orders placed or performed in visit on 11/25/17  CBC with Differential/Platelet  Result Value Ref Range   WBC 5.4 3.4 - 10.8 x10E3/uL   RBC 4.03 3.77 - 5.28 x10E6/uL   Hemoglobin 12.2 11.1 - 15.9 g/dL   Hematocrit 35.6 34.0 - 46.6 %   MCV 88 79 - 97 fL   MCH 30.3 26.6 - 33.0 pg   MCHC 34.3 31.5 - 35.7 g/dL   RDW 14.3 12.3 - 15.4 %   Platelets 268 150 - 450 x10E3/uL   Neutrophils 58 Not Estab. %   Lymphs 24 Not Estab. %   Monocytes 15 Not Estab. %   Eos 2 Not Estab. %   Basos 1 Not Estab. %   Neutrophils  Absolute 3.2 1.4 - 7.0 x10E3/uL   Lymphocytes Absolute 1.3 0.7 - 3.1 x10E3/uL   Monocytes Absolute 0.8 0.1 - 0.9 x10E3/uL   EOS (ABSOLUTE) 0.1 0.0 - 0.4 x10E3/uL   Basophils Absolute 0.0 0.0 - 0.2 x10E3/uL   Immature Granulocytes 0 Not Estab. %   Immature Grans (Abs) 0.0 0.0 - 0.1 x10E3/uL  CMP14+EGFR  Result Value Ref Range   Glucose 96 65 - 99 mg/dL   BUN 17 8 - 27 mg/dL   Creatinine, Ser 1.13 (H) 0.57 - 1.00 mg/dL   GFR calc non Af Amer 47 (L) >59 mL/min/1.73   GFR calc Af Amer 55 (L) >59 mL/min/1.73   BUN/Creatinine Ratio 15 12 - 28   Sodium 132 (L) 134 - 144 mmol/L   Potassium 4.7 3.5 - 5.2 mmol/L   Chloride 94 (L) 96 - 106 mmol/L   CO2 22 20 - 29 mmol/L   Calcium 10.1 8.7 - 10.3 mg/dL   Total Protein 7.0 6.0 - 8.5 g/dL   Albumin 4.3 3.5 - 4.8 g/dL   Globulin, Total 2.7 1.5 - 4.5 g/dL   Albumin/Globulin Ratio 1.6 1.2 - 2.2   Bilirubin Total 0.3 0.0 - 1.2 mg/dL   Alkaline Phosphatase 94 39 - 117 IU/L   AST 21 0 - 40 IU/L   ALT 14 0 - 32 IU/L  Lipid panel  Result Value Ref Range   Cholesterol, Total 136 100 - 199 mg/dL   Triglycerides 73 0 - 149 mg/dL   HDL 62 >39 mg/dL   VLDL Cholesterol Cal 15 5 - 40 mg/dL   LDL Calculated 59 0 - 99 mg/dL   Chol/HDL Ratio 2.2 0.0 - 4.4 ratio  TSH  Result Value Ref Range   TSH 2.850 0.450 - 4.500 uIU/mL  Hemoglobin A1c  Result Value Ref Range   Hgb A1c MFr Bld 6.2 (H) 4.8 - 5.6 %   Est. average glucose Bld gHb Est-mCnc 131 mg/dL      Assessment & Plan:   1. Essential hypertension - CBC with Differential/Platelet - CMP14+EGFR  2. Pure hypercholesterolemia - Lipid panel  3. Elevated hemoglobin A1c - CMP14+EGFR - Hemoglobin A1c  4. Thyroid disease - TSH  5. Need for shingles vaccine - Varicella-zoster vaccine IM (Shingrix)   Continue all other maintenance medications as listed above.  Follow up plan: Return in about 6 months (around 05/28/2018) for recheck.  Educational handout given for [place]  PA-C [facility] [place], [place] [address] [phone]   11/26/2017, 10:29 PM

## 2017-12-01 ENCOUNTER — Other Ambulatory Visit: Payer: Self-pay | Admitting: Physician Assistant

## 2017-12-01 DIAGNOSIS — Z1231 Encounter for screening mammogram for malignant neoplasm of breast: Secondary | ICD-10-CM

## 2018-01-19 ENCOUNTER — Ambulatory Visit
Admission: RE | Admit: 2018-01-19 | Discharge: 2018-01-19 | Disposition: A | Discharge status: Home / Self Care | Payer: Medicare Other | Source: Ambulatory Visit | Attending: Physician Assistant | Admitting: Physician Assistant

## 2018-01-19 DIAGNOSIS — Z961 Presence of intraocular lens: Secondary | ICD-10-CM | POA: Diagnosis not present

## 2018-01-19 DIAGNOSIS — Z1231 Encounter for screening mammogram for malignant neoplasm of breast: Secondary | ICD-10-CM | POA: Diagnosis not present

## 2018-01-19 DIAGNOSIS — H40013 Open angle with borderline findings, low risk, bilateral: Secondary | ICD-10-CM | POA: Diagnosis not present

## 2018-02-22 ENCOUNTER — Ambulatory Visit (INDEPENDENT_AMBULATORY_CARE_PROVIDER_SITE_OTHER): Payer: Medicare Other

## 2018-02-22 DIAGNOSIS — Z23 Encounter for immunization: Secondary | ICD-10-CM

## 2018-03-01 ENCOUNTER — Ambulatory Visit: Payer: Medicare Other | Admitting: Physician Assistant

## 2018-04-12 ENCOUNTER — Ambulatory Visit (INDEPENDENT_AMBULATORY_CARE_PROVIDER_SITE_OTHER): Payer: Medicare Other | Admitting: Physician Assistant

## 2018-04-12 ENCOUNTER — Encounter: Payer: Self-pay | Admitting: Physician Assistant

## 2018-04-12 VITALS — BP 110/55 | HR 92 | Temp 101.1°F | Ht <= 58 in | Wt 155.0 lb

## 2018-04-12 DIAGNOSIS — J101 Influenza due to other identified influenza virus with other respiratory manifestations: Secondary | ICD-10-CM | POA: Diagnosis not present

## 2018-04-12 DIAGNOSIS — R509 Fever, unspecified: Secondary | ICD-10-CM

## 2018-04-12 LAB — VERITOR FLU A/B WAIVED
INFLUENZA B: NEGATIVE
Influenza A: POSITIVE — AB

## 2018-04-12 MED ORDER — OSELTAMIVIR PHOSPHATE 75 MG PO CAPS: 75.0000 mg | ORAL_CAPSULE | Freq: Two times a day (BID) | ORAL | 0 refills | Status: DC

## 2018-04-12 NOTE — Progress Notes (Signed)
BP (!) 110/55 (BP Location: Right Arm, Patient Position: Sitting, Cuff Size: Normal)   Pulse 92   Temp (!) 101.1 F (38.4 C) (Oral)   Ht _0  (1.473 m)   Wt 155 lb (70.3 kg)   BMI 32.40 kg/m    Subjective:    Patient ID: Sherry Cooper, female    DOB: 04/20/1941, 77 y.o.   MRN: [id number]  HPI: Sherry Cooper is a 77 y.o. female presenting on 04/12/2018 for Fever (up to 101.9. Symptoms started yestesrday. ); Cough; and Chills  This patient has had less than 2 days severe fever, chills, myalgias.  Complains of sinus headache and postnasal drainage. There is copious drainage at times. Associated sore throat, decreased appetite and headache.  Has been exposed to influenza.    Past Medical History:  Diagnosis Date  . Arthritis   . Cataract    Bilateral  Surgery on both cant remember dates   . Hyperlipidemia   . Hypertension   . Thyroid disease    Relevant past medical, surgical, family and social history reviewed and updated as indicated. Interim medical history since our last visit reviewed. Allergies and medications reviewed and updated. DATA REVIEWED: CHART IN EPIC  Family History reviewed for pertinent findings.  Review of Systems  Constitutional: Positive for appetite change, chills, fatigue and fever. Negative for activity change.  HENT: Positive for congestion, postnasal drip and sore throat.   Eyes: Negative.   Respiratory: Negative for cough and wheezing.   Cardiovascular: Negative.  Negative for chest pain, palpitations and leg swelling.  Gastrointestinal: Negative.   Genitourinary: Negative.   Musculoskeletal: Positive for myalgias.  Skin: Negative.   Neurological: Positive for headaches.    Allergies as of 04/12/2018   No Known Allergies     Medication List       Accurate as of April 12, 2018 12:56 PM. Always use your most recent med list.        aspirin 81 MG tablet Take 81 mg by mouth daily.   CALTRATE 600 PLUS-VIT D PO Take 1 tablet by mouth  daily.   levothyroxine 50 MCG tablet Commonly known as:  SYNTHROID, LEVOTHROID TAKE 1 TABLET BY MOUTH ONCE DAILY BEFORE BREAKFAST   lisinopril-hydrochlorothiazide 10-12.5 MG tablet Commonly known as:  PRINZIDE,ZESTORETIC Take 1 tablet by mouth daily.   multivitamin tablet Take 1 tablet by mouth daily.   OMEGA 3 PO Take 2 g by mouth 2 (two) times daily.   oseltamivir 75 MG capsule Commonly known as:  TAMIFLU Take 1 capsule (75 mg total) by mouth 2 (two) times daily.   rosuvastatin 20 MG tablet Commonly known as:  CRESTOR Take 1 tablet (20 mg total) by mouth daily.          Objective:    BP (!) 110/55 (BP Location: Right Arm, Patient Position: Sitting, Cuff Size: Normal)   Pulse 92   Temp (!) 101.1 F (38.4 C) (Oral)   Ht _1  (1.473 m)   Wt 155 lb (70.3 kg)   BMI 32.40 kg/m   No Known Allergies  Wt Readings from Last 3 Encounters:  04/12/18 155 lb (70.3 kg)  11/25/17 154 lb 3.2 oz (69.9 kg)  06/02/17 163 lb (73.9 kg)    Physical Exam Vitals signs and nursing note reviewed.  Constitutional:      General: She is not in acute distress.    Appearance: She is well-developed.  HENT:     Head: Normocephalic and atraumatic.  Right Ear: Tympanic membrane normal.     Left Ear: Tympanic membrane normal.     Nose: Mucosal edema and rhinorrhea present.     Right Sinus: No frontal sinus tenderness.     Left Sinus: No frontal sinus tenderness.     Mouth/Throat:     Pharynx: Posterior oropharyngeal erythema present. No oropharyngeal exudate.     Tonsils: No tonsillar abscesses.  Eyes:     Conjunctiva/sclera: Conjunctivae normal.     Pupils: Pupils are equal, round, and reactive to light.  Neck:     Musculoskeletal: Normal range of motion.  Cardiovascular:     Rate and Rhythm: Normal rate and regular rhythm.     Pulses: Normal pulses.     Heart sounds: Normal heart sounds.  Pulmonary:     Effort: Pulmonary effort is normal. No respiratory distress.     Breath  sounds: Normal breath sounds.  Abdominal:     General: Bowel sounds are normal.     Palpations: Abdomen is soft.  Skin:    General: Skin is warm and dry.     Findings: No rash.  Neurological:     Mental Status: She is alert and oriented to person, place, and time.     Deep Tendon Reflexes: Reflexes are normal and symmetric.  Psychiatric:        Behavior: Behavior normal.        Thought Content: Thought content normal.        Judgment: Judgment normal.     Results for orders placed or performed in visit on 11/25/17  CBC with Differential/Platelet  Result Value Ref Range   WBC 5.4 3.4 - 10.8 x10E3/uL   RBC 4.03 3.77 - 5.28 x10E6/uL   Hemoglobin 12.2 11.1 - 15.9 g/dL   Hematocrit 35.6 34.0 - 46.6 %   MCV 88 79 - 97 fL   MCH 30.3 26.6 - 33.0 pg   MCHC 34.3 31.5 - 35.7 g/dL   RDW 14.3 12.3 - 15.4 %   Platelets 268 150 - 450 x10E3/uL   Neutrophils 58 Not Estab. %   Lymphs 24 Not Estab. %   Monocytes 15 Not Estab. %   Eos 2 Not Estab. %   Basos 1 Not Estab. %   Neutrophils Absolute 3.2 1.4 - 7.0 x10E3/uL   Lymphocytes Absolute 1.3 0.7 - 3.1 x10E3/uL   Monocytes Absolute 0.8 0.1 - 0.9 x10E3/uL   EOS (ABSOLUTE) 0.1 0.0 - 0.4 x10E3/uL   Basophils Absolute 0.0 0.0 - 0.2 x10E3/uL   Immature Granulocytes 0 Not Estab. %   Immature Grans (Abs) 0.0 0.0 - 0.1 x10E3/uL  CMP14+EGFR  Result Value Ref Range   Glucose 96 65 - 99 mg/dL   BUN 17 8 - 27 mg/dL   Creatinine, Ser 1.13 (H) 0.57 - 1.00 mg/dL   GFR calc non Af Amer 47 (L) >59 mL/min/1.73   GFR calc Af Amer 55 (L) >59 mL/min/1.73   BUN/Creatinine Ratio 15 12 - 28   Sodium 132 (L) 134 - 144 mmol/L   Potassium 4.7 3.5 - 5.2 mmol/L   Chloride 94 (L) 96 - 106 mmol/L   CO2 22 20 - 29 mmol/L   Calcium 10.1 8.7 - 10.3 mg/dL   Total Protein 7.0 6.0 - 8.5 g/dL   Albumin 4.3 3.5 - 4.8 g/dL   Globulin, Total 2.7 1.5 - 4.5 g/dL   Albumin/Globulin Ratio 1.6 1.2 - 2.2   Bilirubin Total 0.3 0.0 - 1.2 mg/dL   Alkaline  Phosphatase 94 39 -  117 IU/L   AST 21 0 - 40 IU/L   ALT 14 0 - 32 IU/L  Lipid panel  Result Value Ref Range   Cholesterol, Total 136 100 - 199 mg/dL   Triglycerides 73 0 - 149 mg/dL   HDL 62 >39 mg/dL   VLDL Cholesterol Cal 15 5 - 40 mg/dL   LDL Calculated 59 0 - 99 mg/dL   Chol/HDL Ratio 2.2 0.0 - 4.4 ratio  TSH  Result Value Ref Range   TSH 2.850 0.450 - 4.500 uIU/mL  Hemoglobin A1c  Result Value Ref Range   Hgb A1c MFr Bld 6.2 (H) 4.8 - 5.6 %   Est. average glucose Bld gHb Est-mCnc 131 mg/dL      Assessment & Plan:   1. Fever, unspecified fever cause - Veritor Flu A/B Waived  2. Influenza A - oseltamivir (TAMIFLU) 75 MG capsule; Take 1 capsule (75 mg total) by mouth 2 (two) times daily.  Dispense: 10 capsule; Refill: 0   Continue all other maintenance medications as listed above.  Follow up plan: No follow-ups on file.  Educational handout given for influenza  Terald Sleeper PA-C [facility] [address]  [place], [place] [address] [phone]   04/12/2018, 12:56 PM

## 2018-04-12 NOTE — Patient Instructions (Signed)

## 2018-05-28 ENCOUNTER — Ambulatory Visit: Payer: Medicare Other | Admitting: Physician Assistant

## 2018-06-03 ENCOUNTER — Encounter: Payer: Medicare Other | Admitting: *Deleted

## 2018-06-16 ENCOUNTER — Ambulatory Visit: Payer: Medicare Other | Admitting: Family Medicine

## 2018-06-16 ENCOUNTER — Encounter: Payer: Self-pay | Admitting: Family Medicine

## 2018-06-16 VITALS — BP 119/57 | HR 75 | Temp 97.3°F | Ht <= 58 in | Wt 149.6 lb

## 2018-06-16 DIAGNOSIS — E079 Disorder of thyroid, unspecified: Secondary | ICD-10-CM | POA: Diagnosis not present

## 2018-06-16 DIAGNOSIS — I10 Essential (primary) hypertension: Secondary | ICD-10-CM | POA: Diagnosis not present

## 2018-06-16 DIAGNOSIS — E7800 Pure hypercholesterolemia, unspecified: Secondary | ICD-10-CM | POA: Diagnosis not present

## 2018-06-16 MED ORDER — LEVOTHYROXINE SODIUM 50 MCG PO TABS: ORAL_TABLET | ORAL | 3 refills | Status: DC

## 2018-06-16 MED ORDER — LISINOPRIL-HYDROCHLOROTHIAZIDE 10-12.5 MG PO TABS: 1.0000 | ORAL_TABLET | Freq: Every day | ORAL | 3 refills | Status: DC

## 2018-06-16 MED ORDER — ROSUVASTATIN CALCIUM 20 MG PO TABS: 20.0000 mg | ORAL_TABLET | Freq: Every day | ORAL | 3 refills | Status: DC

## 2018-06-16 NOTE — Progress Notes (Signed)
BP (!) 119/57   Pulse 75   Temp (!) 97.3 F (36.3 C) (Oral)   Ht 4' 10"  (1.473 m)   Wt 149 lb 9.6 oz (67.9 kg)   BMI 31.27 kg/m    Subjective:    Patient ID: Sherry Cooper, female    DOB: 1941/02/08, 78 y.o.   MRN: [id number]  HPI: Sherry Cooper is a 78 y.o. female presenting on 06/16/2018 for Hyperlipidemia (check up of chronic medical conditions); Hypertension; and Establish Care Ronnald Ramp pt)   HPI Hypothyroidism recheck Patient is coming in for thyroid recheck today as well. They deny any issues with hair changes or heat or cold problems or diarrhea or constipation. They deny any chest pain or palpitations. They are currently on levothyroxine 50 micrograms   Hypertension Patient is currently on lisinopril-hydrochlorothiazide, and their blood pressure today is 119/57. Patient denies any lightheadedness or dizziness. Patient denies headaches, blurred vision, chest pains, shortness of breath, or weakness. Denies any side effects from medication and is content with current medication.   Hyperlipidemia Patient is coming in for recheck of his hyperlipidemia. The patient is currently taking Crestor and fish oils. They deny any issues with myalgias or history of liver damage from it. They deny any focal numbness or weakness or chest pain.   Relevant past medical, surgical, family and social history reviewed and updated as indicated. Interim medical history since our last visit reviewed. Allergies and medications reviewed and updated.  Review of Systems  Constitutional: Negative for chills and fever.  HENT: Negative for congestion, ear discharge and ear pain.   Eyes: Negative for redness and visual disturbance.  Respiratory: Negative for chest tightness and shortness of breath.   Cardiovascular: Negative for chest pain and leg swelling.  Musculoskeletal: Negative for back pain and gait problem.  Skin: Negative for rash.  Neurological: Negative for dizziness, light-headedness and  headaches.  Psychiatric/Behavioral: Negative for agitation and behavioral problems.  All other systems reviewed and are negative.   Per HPI unless specifically indicated above   Allergies as of 06/16/2018   No Known Allergies     Medication List       Accurate as of June 16, 2018  9:23 AM. Always use your most recent med list.        aspirin 81 MG tablet Take 81 mg by mouth daily.   CALTRATE 600 PLUS-VIT D PO Take 1 tablet by mouth daily.   levothyroxine 50 MCG tablet Commonly known as:  SYNTHROID, LEVOTHROID TAKE 1 TABLET BY MOUTH ONCE DAILY BEFORE BREAKFAST   lisinopril-hydrochlorothiazide 10-12.5 MG tablet Commonly known as:  PRINZIDE,ZESTORETIC Take 1 tablet by mouth daily.   multivitamin tablet Take 1 tablet by mouth daily.   OMEGA 3 PO Take 2 g by mouth 2 (two) times daily.   rosuvastatin 20 MG tablet Commonly known as:  CRESTOR Take 1 tablet (20 mg total) by mouth daily.          Objective:    BP (!) 119/57   Pulse 75   Temp (!) 97.3 F (36.3 C) (Oral)   Ht 4' 10"  (1.473 m)   Wt 149 lb 9.6 oz (67.9 kg)   BMI 31.27 kg/m   Wt Readings from Last 3 Encounters:  06/16/18 149 lb 9.6 oz (67.9 kg)  04/12/18 155 lb (70.3 kg)  11/25/17 154 lb 3.2 oz (69.9 kg)    Physical Exam Vitals signs and nursing note reviewed.  Constitutional:      General: She is not  in acute distress.    Appearance: She is well-developed. She is not diaphoretic.  Eyes:     Conjunctiva/sclera: Conjunctivae normal.  Cardiovascular:     Rate and Rhythm: Normal rate and regular rhythm.     Heart sounds: Normal heart sounds. No murmur.  Pulmonary:     Effort: Pulmonary effort is normal. No respiratory distress.     Breath sounds: Normal breath sounds. No wheezing.  Musculoskeletal: Normal range of motion.        General: No tenderness.  Skin:    General: Skin is warm and dry.     Findings: No rash.  Neurological:     Mental Status: She is alert and oriented to person,  place, and time.     Coordination: Coordination normal.  Psychiatric:        Behavior: Behavior normal.         Assessment & Plan:   Problem List Items Addressed This Visit      Cardiovascular and Mediastinum   Hypertension   Relevant Medications   rosuvastatin (CRESTOR) 20 MG tablet   lisinopril-hydrochlorothiazide (PRINZIDE,ZESTORETIC) 10-12.5 MG tablet   Other Relevant Orders   CMP14+EGFR     Endocrine   Thyroid disease - Primary   Relevant Medications   levothyroxine (SYNTHROID, LEVOTHROID) 50 MCG tablet   Other Relevant Orders   TSH     Other   Hyperlipidemia   Relevant Medications   rosuvastatin (CRESTOR) 20 MG tablet   lisinopril-hydrochlorothiazide (PRINZIDE,ZESTORETIC) 10-12.5 MG tablet       Follow up plan: Return in about 6 months (around 12/15/2018), or if symptoms worsen or fail to improve, for thyroid and htn and hld.  Counseling provided for all of the vaccine components Orders Placed This Encounter  Procedures  . CMP14+EGFR  . TSH    Caryl Pina, MD [facility] 06/16/2018, 9:23 AM

## 2018-06-17 LAB — CMP14+EGFR
A/G RATIO: 2.3 — AB (ref 1.2–2.2)
ALBUMIN: 4.5 g/dL (ref 3.7–4.7)
ALK PHOS: 89 IU/L (ref 39–117)
ALT: 14 IU/L (ref 0–32)
AST: 20 IU/L (ref 0–40)
BUN / CREAT RATIO: 20 (ref 12–28)
BUN: 23 mg/dL (ref 8–27)
Bilirubin Total: 0.3 mg/dL (ref 0.0–1.2)
CO2: 21 mmol/L (ref 20–29)
CREATININE: 1.16 mg/dL — AB (ref 0.57–1.00)
Calcium: 10.1 mg/dL (ref 8.7–10.3)
Chloride: 99 mmol/L (ref 96–106)
GFR calc Af Amer: 53 mL/min/{1.73_m2} — ABNORMAL LOW (ref >59)
GFR calc non Af Amer: 46 mL/min/{1.73_m2} — ABNORMAL LOW (ref >59)
GLOBULIN, TOTAL: 2 g/dL (ref 1.5–4.5)
Glucose: 83 mg/dL (ref 65–99)
POTASSIUM: 4.3 mmol/L (ref 3.5–5.2)
SODIUM: 138 mmol/L (ref 134–144)
Total Protein: 6.5 g/dL (ref 6.0–8.5)

## 2018-06-17 LAB — TSH: TSH: 2.34 u[IU]/mL (ref 0.450–4.500)

## 2018-06-18 ENCOUNTER — Telehealth: Payer: Self-pay | Admitting: Family Medicine

## 2018-06-18 NOTE — Telephone Encounter (Signed)
Labs given to patient  

## 2018-06-21 ENCOUNTER — Ambulatory Visit (INDEPENDENT_AMBULATORY_CARE_PROVIDER_SITE_OTHER): Payer: Medicare Other | Admitting: *Deleted

## 2018-06-21 VITALS — BP 111/63 | HR 69 | Temp 97.3°F | Ht <= 58 in | Wt 150.0 lb

## 2018-06-21 DIAGNOSIS — Z0000 Encounter for general adult medical examination without abnormal findings: Secondary | ICD-10-CM

## 2018-06-21 NOTE — Progress Notes (Signed)
Subjective:   Sherry Cooper is a 78 y.o. female who presents for Medicare Annual (Subsequent) preventive examination. She is retired from [employer]. She enjoys working cross-word puzzles and watching college basketball. She walks and climbs her stairs at home for exercise. She states that her diet is semi-healthy and she typically eats 3 meals a day. She is currently not active in church. She lives by herself as she is widowed. She has 2 children that both live local and she has 3 grandchildren and 3 great-grandchildren. She does not have any pets and fall risks were discussed today. She says that her health is about the same as a year ago.    Cardiac Risk Factors include: hypertension;dyslipidemia;advanced age (>93men, >8 women)     Objective:     Vitals: BP 111/63 (BP Location: Left Arm)   Pulse 69   Temp (!) 97.3 F (36.3 C) (Oral)   Ht 4\' 10"  (1.473 m)   Wt 150 lb (68 kg)   BMI 31.35 kg/m   Body mass index is 31.35 kg/m.  Advanced Directives 06/21/2018 06/02/2017  Does Patient Have a Medical Advance Directive? No No  Would patient like information on creating a medical advance directive? Yes (MAU/Ambulatory/Procedural Areas - Information given) Yes (MAU/Ambulatory/Procedural Areas - Information given)    Tobacco Social History   Tobacco Use  Smoking Status Never Smoker  Smokeless Tobacco Never Used     Counseling given: Not Answered   Past Medical History:  Diagnosis Date  . Arthritis   . Cataract    Bilateral  Surgery on both cant remember dates   . Hyperlipidemia   . Hypertension   . Thyroid disease    Past Surgical History:  Procedure Laterality Date  . ABDOMINAL HYSTERECTOMY  1987  . CHOLECYSTECTOMY  05/31/2002   laparoscopic  . DILATION AND CURETTAGE OF UTERUS  1987  . EYE SURGERY Bilateral    cataracts removed    Family History  Problem Relation Age of Onset  . Cancer Sister        uterine and breast and lung  . Diabetes Brother   .  Diabetes Sister   . Congestive Heart Failure Father   . COPD Brother   . Migraines Daughter   . Colon cancer Neg Hx   . Stomach cancer Neg Hx    Social History   Socioeconomic History  . Marital status: Widowed    Spouse name: Not on file  . Number of children: 2  . Years of education: 48  . Highest education level: 12th grade  Occupational History  . Occupation: Retired     Comment: Barrister's clerk   Social Needs  . Financial resource strain: Not hard at all  . Food insecurity:    Worry: Never true    Inability: Never true  . Transportation needs:    Medical: No    Non-medical: No  Tobacco Use  . Smoking status: Never Smoker  . Smokeless tobacco: Never Used  Substance and Sexual Activity  . Alcohol use: No    Frequency: Never  . Drug use: No  . Sexual activity: Not Currently  Lifestyle  . Physical activity:    Days per week: 3 days    Minutes per session: 10 min  . Stress: Not at all  Relationships  . Social connections:    Talks on phone: More than three times a week    Gets together: Not on file    Attends religious service:  More than 4 times per year    Active member of club or organization: No    Attends meetings of clubs or organizations: Never    Relationship status: Widowed  Other Topics Concern  . Not on file  Social History Narrative  . Not on file    Outpatient Encounter Medications as of 06/21/2018  Medication Sig  . aspirin 81 MG tablet Take 81 mg by mouth daily.    . Calcium-Vitamin D (CALTRATE 600 PLUS-VIT D PO) Take 1 tablet by mouth daily.   Marland Kitchen levothyroxine (SYNTHROID, LEVOTHROID) 50 MCG tablet TAKE 1 TABLET BY MOUTH ONCE DAILY BEFORE BREAKFAST  . lisinopril-hydrochlorothiazide (PRINZIDE,ZESTORETIC) 10-12.5 MG tablet Take 1 tablet by mouth daily.  . Multiple Vitamin (MULTIVITAMIN) tablet Take 1 tablet by mouth daily.    . Omega-3 Fatty Acids (OMEGA 3 PO) Take 2 g by mouth 2 (two) times daily.    . rosuvastatin (CRESTOR) 20 MG tablet Take  1 tablet (20 mg total) by mouth daily.   No facility-administered encounter medications on file as of 06/21/2018.     Activities of Daily Living In your present state of health, do you have any difficulty performing the following activities: 06/21/2018  Hearing? N  Vision? N  Difficulty concentrating or making decisions? N  Walking or climbing stairs? N  Dressing or bathing? N  Doing errands, shopping? N  Preparing Food and eating ? N  Using the Toilet? N  In the past six months, have you accidently leaked urine? N  Do you have problems with loss of bowel control? N  Managing your Medications? N  Managing your Finances? N  Housekeeping or managing your Housekeeping? N  Some recent data might be hidden    Patient Care Team: Dettinger, Elige Radon, MD as PCP - General (Family Medicine) Ernesto Rutherford, MD as Consulting Physician (Ophthalmology)    Assessment:   This is a routine wellness examination for Chidera.  Exercise Activities and Dietary recommendations Current Exercise Habits: Home exercise routine, Type of exercise: walking(stairs ), Time (Minutes): 15, Frequency (Times/Week): 7, Weekly Exercise (Minutes/Week): 105, Intensity: Mild, Exercise limited by: None identified  Goals    . Have 3 meals a day     Try to eat 3 meals a day including fruits and vegetables. Low fat.cholesterol diet handouts given     . Prevent falls     Stay active     . Weight (lb) < 125 lb (56.7 kg)       Fall Risk Fall Risk  06/21/2018 06/16/2018 11/25/2017 06/02/2017 05/28/2017  Falls in the past year? 0 0 No No No   Is the patient's home free of loose throw rugs in walkways, pet beds, electrical cords, etc? Fall hazards and risks were discussed today   Depression Screen PHQ 2/9 Scores 06/21/2018 06/16/2018 11/25/2017 06/02/2017  PHQ - 2 Score 0 0 0 0     Cognitive Function MMSE - Mini Mental State Exam 06/21/2018 06/02/2017  Orientation to time 5 5  Orientation to Place 5 5  Registration 3 3  Attention/  Calculation 5 5  Recall 3 3  Language- name 2 objects 2 2  Language- repeat 1 1  Language- follow 3 step command 3 3  Language- read & follow direction 1 1  Write a sentence 1 1  Copy design 1 1  Total score 30 30        Immunization History  Administered Date(s) Administered  . Influenza, High Dose Seasonal PF 02/01/2016, 02/10/2017,  02/22/2018  . Influenza,inj,Quad PF,6+ Mos 01/24/2013  . Influenza-Unspecified 02/01/2014, 03/06/2015  . Pneumococcal Conjugate-13 12/22/2013  . Pneumococcal-Unspecified 11/20/2010  . Tdap 07/21/2006  . Zoster 04/21/2006  . Zoster Recombinat (Shingrix) 09/09/2017, 11/25/2017    Qualifies for Shingles Vaccine? She has had all available shingles vaccines.   Screening Tests Health Maintenance  Topic Date Due  . TETANUS/TDAP  04/21/2020  . INFLUENZA VACCINE  Completed  . DEXA SCAN  Completed  . PNA vac Low Risk Adult  Completed    Cancer Screenings: Lung: Low Dose CT Chest recommended if Age 38-80 years, 30 pack-year currently smoking OR have quit w/in 15years. Patient does not qualify. Breast:  Up to date on Mammogram? Yes   Up to date of Bone Density/Dexa? Yes Colorectal: due at next OV  Additional Screenings: declined  Hepatitis C Screening:      Plan:   pt will keep follow up with Dr Dettinger and her eye DR. I would recommend a CXR at next OV = family hx cancer. She is to stay active and try to prevent falls. She is due to tdap and it was price checked today at $47 - she declined.   I have personally reviewed and noted the following in the patient's chart:   . Medical and social history . Use of alcohol, tobacco or illicit drugs  . Current medications and supplements . Functional ability and status . Nutritional status . Physical activity . Advanced directives . List of other physicians . Hospitalizations, surgeries, and ER visits in previous 12 months . Vitals . Screenings to include cognitive, depression, and  falls . Referrals and appointments  In addition, I have reviewed and discussed with patient certain preventive protocols, quality metrics, and best practice recommendations. A written personalized care plan for preventive services as well as general preventive health recommendations were provided to patient.     Leilani Merl, [place]  04/24/3886

## 2018-06-21 NOTE — Patient Instructions (Signed)
  Sherry Cooper , Thank you for taking time to come for your Medicare Wellness Visit. I appreciate your ongoing commitment to your health goals. Please review the following plan we discussed and let me know if I can assist you in the future.   These are the goals we discussed: Goals    . Have 3 meals a day     Try to eat 3 meals a day including fruits and vegetables. Low fat.cholesterol diet handouts given     . Prevent falls     Stay active     . Weight (lb) < 125 lb (56.7 kg)       This is a list of the screening recommended for you and due dates:  Health Maintenance  Topic Date Due  . Tetanus Vaccine  04/21/2020  . Flu Shot  Completed  . DEXA scan (bone density measurement)  Completed  . Pneumonia vaccines  Completed    Keep follow up with Dr Dettinger and your eye Dr  Try to prevent falls and stay active

## 2018-09-20 ENCOUNTER — Telehealth: Payer: Self-pay | Admitting: Family Medicine

## 2018-09-20 NOTE — Telephone Encounter (Signed)
Pt aware.

## 2018-09-20 NOTE — Telephone Encounter (Signed)
Yes that is okay, just a different brand, the most common one is Synthroid but Euthyrox is just a different brand.

## 2018-12-03 ENCOUNTER — Telehealth: Payer: Self-pay | Admitting: Family Medicine

## 2018-12-03 NOTE — Telephone Encounter (Signed)
Returned patient's call and she wanted to know about her tele visit.

## 2018-12-15 ENCOUNTER — Encounter: Payer: Self-pay | Admitting: Family Medicine

## 2018-12-15 ENCOUNTER — Ambulatory Visit (INDEPENDENT_AMBULATORY_CARE_PROVIDER_SITE_OTHER): Payer: Medicare Other | Admitting: Family Medicine

## 2018-12-15 DIAGNOSIS — I10 Essential (primary) hypertension: Secondary | ICD-10-CM | POA: Diagnosis not present

## 2018-12-15 DIAGNOSIS — E079 Disorder of thyroid, unspecified: Secondary | ICD-10-CM

## 2018-12-15 DIAGNOSIS — E7800 Pure hypercholesterolemia, unspecified: Secondary | ICD-10-CM

## 2018-12-15 NOTE — Progress Notes (Signed)
Virtual Visit via telephone Note  I connected with Sherry Cooper on 12/15/18 at 0846 by telephone and verified that I am speaking with the correct person using two identifiers. Sherry Cooper is currently located at home and no other people are currently with her during visit. The provider, Elige RadonJoshua A Kihanna Kamiya, MD is located in their office at time of visit.  Call ended at [phone]  I discussed the limitations, risks, security and privacy concerns of performing an evaluation and management service by telephone and the availability of in person appointments. I also discussed with the patient that there may be a patient responsible charge related to this service. The patient expressed understanding and agreed to proceed.   History and Present Illness: Hypertension Patient is currently on lisinopril-hctz, and their blood pressure today is unknown. Patient denies any lightheadedness or dizziness. Patient denies headaches, blurred vision, chest pains, shortness of breath, or weakness. Denies any side effects from medication and is content with current medication.   Hyperlipidemia Patient is coming in for recheck of his hyperlipidemia. The patient is currently taking Crestor and fish oils. They deny any issues with myalgias or history of liver damage from it. They deny any focal numbness or weakness or chest pain.   Hypothyroidism recheck Patient is coming in for thyroid recheck today as well. They deny any issues with hair changes or heat or cold problems or diarrhea or constipation. They deny any chest pain or palpitations. They are currently on levothyroxine 50micrograms   No diagnosis found.  Outpatient Encounter Medications as of 12/15/2018  Medication Sig  . aspirin 81 MG tablet Take 81 mg by mouth daily.    . Calcium-Vitamin D (CALTRATE 600 PLUS-VIT D PO) Take 1 tablet by mouth daily.   Marland Kitchen. levothyroxine (SYNTHROID, LEVOTHROID) 50 MCG tablet TAKE 1 TABLET BY MOUTH ONCE DAILY BEFORE BREAKFAST  .  lisinopril-hydrochlorothiazide (PRINZIDE,ZESTORETIC) 10-12.5 MG tablet Take 1 tablet by mouth daily.  . Multiple Vitamin (MULTIVITAMIN) tablet Take 1 tablet by mouth daily.    . Omega-3 Fatty Acids (OMEGA 3 PO) Take 2 g by mouth 2 (two) times daily.    . rosuvastatin (CRESTOR) 20 MG tablet Take 1 tablet (20 mg total) by mouth daily.   No facility-administered encounter medications on file as of 12/15/2018.     Review of Systems  Constitutional: Negative for chills and fever.  Eyes: Negative for visual disturbance.  Respiratory: Negative for chest tightness and shortness of breath.   Cardiovascular: Negative for chest pain and leg swelling.  Endocrine: Negative for cold intolerance, heat intolerance, polydipsia and polyuria.  Musculoskeletal: Negative for back pain and gait problem.  Skin: Negative for rash.  Neurological: Negative for dizziness, light-headedness and headaches.  Psychiatric/Behavioral: Negative for agitation and behavioral problems.  All other systems reviewed and are negative.   Observations/Objective: Patient sounds comfortable and in no acute distress  Assessment and Plan: Problem List Items Addressed This Visit      Cardiovascular and Mediastinum   Hypertension     Endocrine   Thyroid disease - Primary     Other   Hyperlipidemia       Follow Up Instructions: Follow up in 6 months  Recheck in 6 months with labs  No changes in medication   I discussed the assessment and treatment plan with the patient. The patient was provided an opportunity to ask questions and all were answered. The patient agreed with the plan and demonstrated an understanding of the instructions.   The patient  was advised to call back or seek an in-person evaluation if the symptoms worsen or if the condition fails to improve as anticipated.  The above assessment and management plan was discussed with the patient. The patient verbalized understanding of and has agreed to the  management plan. Patient is aware to call the clinic if symptoms persist or worsen. Patient is aware when to return to the clinic for a follow-up visit. Patient educated on when it is appropriate to go to the emergency department.    I provided 9 minutes of non-face-to-face time during this encounter.    Worthy Rancher, MD

## 2018-12-17 ENCOUNTER — Other Ambulatory Visit: Payer: Self-pay | Admitting: Family Medicine

## 2018-12-17 DIAGNOSIS — Z1231 Encounter for screening mammogram for malignant neoplasm of breast: Secondary | ICD-10-CM

## 2019-01-25 DIAGNOSIS — Z961 Presence of intraocular lens: Secondary | ICD-10-CM | POA: Diagnosis not present

## 2019-01-25 DIAGNOSIS — H43813 Vitreous degeneration, bilateral: Secondary | ICD-10-CM | POA: Diagnosis not present

## 2019-01-25 DIAGNOSIS — H40013 Open angle with borderline findings, low risk, bilateral: Secondary | ICD-10-CM | POA: Diagnosis not present

## 2019-02-01 ENCOUNTER — Ambulatory Visit
Admission: RE | Admit: 2019-02-01 | Discharge: 2019-02-01 | Disposition: A | Discharge status: Home / Self Care | Payer: Medicare Other | Source: Ambulatory Visit | Attending: Family Medicine | Admitting: Family Medicine

## 2019-02-01 ENCOUNTER — Telehealth: Payer: Self-pay | Admitting: Family Medicine

## 2019-02-01 ENCOUNTER — Other Ambulatory Visit: Payer: Self-pay

## 2019-02-01 DIAGNOSIS — Z1231 Encounter for screening mammogram for malignant neoplasm of breast: Secondary | ICD-10-CM

## 2019-02-01 NOTE — Telephone Encounter (Signed)
Patient given appt for flu shot and also orders for lab work have previously been placed to have done as well when she comes in

## 2019-02-03 ENCOUNTER — Other Ambulatory Visit: Payer: Self-pay

## 2019-02-04 ENCOUNTER — Other Ambulatory Visit: Payer: Self-pay

## 2019-02-04 ENCOUNTER — Ambulatory Visit (INDEPENDENT_AMBULATORY_CARE_PROVIDER_SITE_OTHER): Payer: Medicare Other

## 2019-02-04 DIAGNOSIS — E7800 Pure hypercholesterolemia, unspecified: Secondary | ICD-10-CM

## 2019-02-04 DIAGNOSIS — E079 Disorder of thyroid, unspecified: Secondary | ICD-10-CM | POA: Diagnosis not present

## 2019-02-04 DIAGNOSIS — I10 Essential (primary) hypertension: Secondary | ICD-10-CM | POA: Diagnosis not present

## 2019-02-04 DIAGNOSIS — Z23 Encounter for immunization: Secondary | ICD-10-CM

## 2019-02-04 NOTE — Addendum Note (Signed)
Addended by: Earlene Plater on: 02/04/2019 09:58 AM   Modules accepted: Orders

## 2019-02-05 LAB — CMP14+EGFR
ALT: 53 IU/L — ABNORMAL HIGH (ref 0–32)
AST: 56 IU/L — ABNORMAL HIGH (ref 0–40)
Albumin/Globulin Ratio: 1.7 (ref 1.2–2.2)
Albumin: 4.5 g/dL (ref 3.7–4.7)
Alkaline Phosphatase: 97 IU/L (ref 39–117)
BUN/Creatinine Ratio: 20 (ref 12–28)
BUN: 30 mg/dL — ABNORMAL HIGH (ref 8–27)
Bilirubin Total: 0.4 mg/dL (ref 0.0–1.2)
CO2: 20 mmol/L (ref 20–29)
Calcium: 10.3 mg/dL (ref 8.7–10.3)
Chloride: 95 mmol/L — ABNORMAL LOW (ref 96–106)
Creatinine, Ser: 1.5 mg/dL — ABNORMAL HIGH (ref 0.57–1.00)
GFR calc Af Amer: 38 mL/min/{1.73_m2} — ABNORMAL LOW (ref >59)
GFR calc non Af Amer: 33 mL/min/{1.73_m2} — ABNORMAL LOW (ref >59)
Globulin, Total: 2.6 g/dL (ref 1.5–4.5)
Glucose: 79 mg/dL (ref 65–99)
Potassium: 4.9 mmol/L (ref 3.5–5.2)
Sodium: 132 mmol/L — ABNORMAL LOW (ref 134–144)
Total Protein: 7.1 g/dL (ref 6.0–8.5)

## 2019-02-05 LAB — CBC WITH DIFFERENTIAL/PLATELET
Basophils Absolute: 0.1 10*3/uL (ref 0.0–0.2)
Basos: 1 %
EOS (ABSOLUTE): 0 10*3/uL (ref 0.0–0.4)
Eos: 0 %
Hematocrit: 31.8 % — ABNORMAL LOW (ref 34.0–46.6)
Hemoglobin: 10.8 g/dL — ABNORMAL LOW (ref 11.1–15.9)
Immature Grans (Abs): 0 10*3/uL (ref 0.0–0.1)
Immature Granulocytes: 0 %
Lymphocytes Absolute: 1.7 10*3/uL (ref 0.7–3.1)
Lymphs: 22 %
MCH: 29.7 pg (ref 26.6–33.0)
MCHC: 34 g/dL (ref 31.5–35.7)
MCV: 87 fL (ref 79–97)
Monocytes Absolute: 0.8 10*3/uL (ref 0.1–0.9)
Monocytes: 11 %
Neutrophils Absolute: 5 10*3/uL (ref 1.4–7.0)
Neutrophils: 66 %
Platelets: 227 10*3/uL (ref 150–450)
RBC: 3.64 x10E6/uL — ABNORMAL LOW (ref 3.77–5.28)
RDW: 12.9 % (ref 11.7–15.4)
WBC: 7.6 10*3/uL (ref 3.4–10.8)

## 2019-02-05 LAB — LIPID PANEL
Chol/HDL Ratio: 2.3 ratio (ref 0.0–4.4)
Cholesterol, Total: 146 mg/dL (ref 100–199)
HDL: 63 mg/dL (ref >39)
LDL Chol Calc (NIH): 71 mg/dL (ref 0–99)
Triglycerides: 59 mg/dL (ref 0–149)
VLDL Cholesterol Cal: 12 mg/dL (ref 5–40)

## 2019-02-05 LAB — TSH: TSH: 2.18 u[IU]/mL (ref 0.450–4.500)

## 2019-02-08 ENCOUNTER — Telehealth: Payer: Self-pay | Admitting: Family Medicine

## 2019-02-08 NOTE — Telephone Encounter (Signed)
Patient aware of results per Janett Billow.

## 2019-02-15 ENCOUNTER — Other Ambulatory Visit: Payer: Self-pay

## 2019-02-15 ENCOUNTER — Other Ambulatory Visit: Payer: Medicare Other

## 2019-02-15 DIAGNOSIS — I10 Essential (primary) hypertension: Secondary | ICD-10-CM | POA: Diagnosis not present

## 2019-02-15 LAB — CMP14+EGFR
ALT: 36 IU/L — ABNORMAL HIGH (ref 0–32)
AST: 37 IU/L (ref 0–40)
Albumin/Globulin Ratio: 1.9 (ref 1.2–2.2)
Albumin: 4.3 g/dL (ref 3.7–4.7)
Alkaline Phosphatase: 102 IU/L (ref 39–117)
BUN/Creatinine Ratio: 15 (ref 12–28)
BUN: 17 mg/dL (ref 8–27)
Bilirubin Total: 0.4 mg/dL (ref 0.0–1.2)
CO2: 21 mmol/L (ref 20–29)
Calcium: 9.8 mg/dL (ref 8.7–10.3)
Chloride: 90 mmol/L — ABNORMAL LOW (ref 96–106)
Creatinine, Ser: 1.11 mg/dL — ABNORMAL HIGH (ref 0.57–1.00)
GFR calc Af Amer: 55 mL/min/{1.73_m2} — ABNORMAL LOW (ref >59)
GFR calc non Af Amer: 48 mL/min/{1.73_m2} — ABNORMAL LOW (ref >59)
Globulin, Total: 2.3 g/dL (ref 1.5–4.5)
Glucose: 93 mg/dL (ref 65–99)
Potassium: 4.2 mmol/L (ref 3.5–5.2)
Sodium: 127 mmol/L — ABNORMAL LOW (ref 134–144)
Total Protein: 6.6 g/dL (ref 6.0–8.5)

## 2019-02-18 ENCOUNTER — Other Ambulatory Visit: Payer: Medicare Other

## 2019-02-18 ENCOUNTER — Other Ambulatory Visit: Payer: Self-pay

## 2019-02-18 DIAGNOSIS — E871 Hypo-osmolality and hyponatremia: Secondary | ICD-10-CM | POA: Diagnosis not present

## 2019-02-19 LAB — BMP8+EGFR
BUN/Creatinine Ratio: 14 (ref 12–28)
BUN: 16 mg/dL (ref 8–27)
CO2: 23 mmol/L (ref 20–29)
Calcium: 10.1 mg/dL (ref 8.7–10.3)
Chloride: 87 mmol/L — ABNORMAL LOW (ref 96–106)
Creatinine, Ser: 1.13 mg/dL — ABNORMAL HIGH (ref 0.57–1.00)
GFR calc Af Amer: 54 mL/min/{1.73_m2} — ABNORMAL LOW (ref >59)
GFR calc non Af Amer: 47 mL/min/{1.73_m2} — ABNORMAL LOW (ref >59)
Glucose: 90 mg/dL (ref 65–99)
Potassium: 4.7 mmol/L (ref 3.5–5.2)
Sodium: 123 mmol/L — ABNORMAL LOW (ref 134–144)

## 2019-02-21 ENCOUNTER — Telehealth: Payer: Self-pay | Admitting: Family Medicine

## 2019-02-21 ENCOUNTER — Other Ambulatory Visit: Payer: Self-pay

## 2019-02-21 ENCOUNTER — Other Ambulatory Visit: Payer: Medicare Other

## 2019-02-21 DIAGNOSIS — E871 Hypo-osmolality and hyponatremia: Secondary | ICD-10-CM | POA: Diagnosis not present

## 2019-02-21 NOTE — Telephone Encounter (Signed)
Refer to other phone note . 

## 2019-02-21 NOTE — Telephone Encounter (Signed)
Patient sodium worsened again, she needs to make sure she is getting salt in her diet and not drinking excessive amounts of straight water.  I want her to come in and check it again with a urine and I am going to check a couple other things to see if we can find out why it is heading in the wrong direction.  She needs to again watch for symptoms including confusion and lightheadedness and dizziness, if any the symptoms arise or then she is to go to the emergency department. Caryl Pina, MD [facility] 02/21/2019, 10:48 AM

## 2019-02-21 NOTE — Telephone Encounter (Signed)
NA

## 2019-02-21 NOTE — Telephone Encounter (Signed)
Patient aware and verbalized understanding. She will come back in to get labs

## 2019-02-22 LAB — BMP8+EGFR
BUN/Creatinine Ratio: 17 (ref 12–28)
BUN: 16 mg/dL (ref 8–27)
CO2: 21 mmol/L (ref 20–29)
Calcium: 9.7 mg/dL (ref 8.7–10.3)
Chloride: 90 mmol/L — ABNORMAL LOW (ref 96–106)
Creatinine, Ser: 0.95 mg/dL (ref 0.57–1.00)
GFR calc Af Amer: 67 mL/min/{1.73_m2} (ref >59)
GFR calc non Af Amer: 58 mL/min/{1.73_m2} — ABNORMAL LOW (ref >59)
Glucose: 91 mg/dL (ref 65–99)
Potassium: 4.4 mmol/L (ref 3.5–5.2)
Sodium: 124 mmol/L — ABNORMAL LOW (ref 134–144)

## 2019-02-22 LAB — OSMOLALITY: Osmolality Meas: 261 mOsmol/kg — ABNORMAL LOW (ref 280–301)

## 2019-02-22 LAB — SODIUM, URINE, RANDOM: Sodium, Ur: 39 mmol/L

## 2019-02-22 LAB — OSMOLALITY, URINE: Osmolality, Ur: 204 mOsmol/kg

## 2019-02-25 ENCOUNTER — Telehealth: Payer: Self-pay | Admitting: Family Medicine

## 2019-02-25 DIAGNOSIS — E871 Hypo-osmolality and hyponatremia: Secondary | ICD-10-CM

## 2019-02-25 MED ORDER — LISINOPRIL 10 MG PO TABS: 10.0000 mg | ORAL_TABLET | Freq: Every day | ORAL | 3 refills | Status: DC

## 2019-02-25 NOTE — Telephone Encounter (Signed)
Patient aware.

## 2019-02-25 NOTE — Telephone Encounter (Signed)
Have patient stop the lisinopril hydrochlorothiazide and I have sent in replacement a pill that she just lisinopril.  I want her to come in Tuesday or Wednesday of next week and repeat a basic metabolic panel, again stop the hydrochlorothiazide part Caryl Pina, MD [facility] 02/25/2019, 8:34 AM

## 2019-02-28 ENCOUNTER — Telehealth: Payer: Self-pay | Admitting: Family Medicine

## 2019-02-28 NOTE — Telephone Encounter (Signed)
Called was called she was asking if she was suppose to take Lisinopril. Patient was explained that was the new medication she is to take.

## 2019-03-01 ENCOUNTER — Other Ambulatory Visit: Payer: Medicare Other

## 2019-03-01 ENCOUNTER — Other Ambulatory Visit: Payer: Self-pay

## 2019-03-01 DIAGNOSIS — E871 Hypo-osmolality and hyponatremia: Secondary | ICD-10-CM | POA: Diagnosis not present

## 2019-03-02 ENCOUNTER — Telehealth: Payer: Self-pay | Admitting: Family Medicine

## 2019-03-02 LAB — BMP8+EGFR
BUN/Creatinine Ratio: 13 (ref 12–28)
BUN: 15 mg/dL (ref 8–27)
CO2: 25 mmol/L (ref 20–29)
Calcium: 10 mg/dL (ref 8.7–10.3)
Chloride: 99 mmol/L (ref 96–106)
Creatinine, Ser: 1.13 mg/dL — ABNORMAL HIGH (ref 0.57–1.00)
GFR calc Af Amer: 54 mL/min/{1.73_m2} — ABNORMAL LOW (ref >59)
GFR calc non Af Amer: 47 mL/min/{1.73_m2} — ABNORMAL LOW (ref >59)
Glucose: 88 mg/dL (ref 65–99)
Potassium: 4.5 mmol/L (ref 3.5–5.2)
Sodium: 136 mmol/L (ref 134–144)

## 2019-03-02 NOTE — Telephone Encounter (Signed)
Patient aware.

## 2019-06-14 ENCOUNTER — Other Ambulatory Visit: Payer: Self-pay

## 2019-06-15 ENCOUNTER — Other Ambulatory Visit: Payer: Self-pay

## 2019-06-15 ENCOUNTER — Encounter: Payer: Self-pay | Admitting: Family Medicine

## 2019-06-15 ENCOUNTER — Ambulatory Visit: Payer: Medicare Other | Admitting: Family Medicine

## 2019-06-15 VITALS — BP 120/65 | HR 60 | Temp 98.4°F | Ht <= 58 in | Wt 136.8 lb

## 2019-06-15 DIAGNOSIS — E7800 Pure hypercholesterolemia, unspecified: Secondary | ICD-10-CM | POA: Diagnosis not present

## 2019-06-15 DIAGNOSIS — E079 Disorder of thyroid, unspecified: Secondary | ICD-10-CM | POA: Diagnosis not present

## 2019-06-15 DIAGNOSIS — I10 Essential (primary) hypertension: Secondary | ICD-10-CM

## 2019-06-15 MED ORDER — ROSUVASTATIN CALCIUM 20 MG PO TABS: 20.0000 mg | ORAL_TABLET | Freq: Every day | ORAL | 3 refills | Status: DC

## 2019-06-15 MED ORDER — LEVOTHYROXINE SODIUM 50 MCG PO TABS: ORAL_TABLET | ORAL | 3 refills | Status: DC

## 2019-06-15 NOTE — Progress Notes (Signed)
BP 120/65   Pulse 60   Temp 98.4 F (36.9 C) (Temporal)   Ht _0  (1.473 m)   Wt 136 lb 12.8 oz (62.1 kg)   SpO2 98%   BMI 28.59 kg/m    Subjective:   Patient ID: Sherry Cooper, female    DOB: 1940/05/15, 79 y.o.   MRN: [id number]  HPI: Sherry Cooper is a 79 y.o. female presenting on 06/15/2019 for check up of thyroid disease (6 month follow up)   HPI Hypothyroidism recheck Patient is coming in for thyroid recheck today as well. They deny any issues with hair changes or heat or cold problems or diarrhea or constipation. They deny any chest pain or palpitations. They are currently on levothyroxine 60mcrograms   Hyperlipidemia Patient is coming in for recheck of his hyperlipidemia. The patient is currently taking omega-3 and Crestor. They deny any issues with myalgias or history of liver damage from it. They deny any focal numbness or weakness or chest pain.   Hypertension Patient is currently on lisinopril, and their blood pressure today is 120/65. Patient denies any lightheadedness or dizziness. Patient denies headaches, blurred vision, chest pains, shortness of breath, or weakness. Denies any side effects from medication and is content with current medication.   Relevant past medical, surgical, family and social history reviewed and updated as indicated. Interim medical history since our last visit reviewed. Allergies and medications reviewed and updated.  Review of Systems  Constitutional: Negative for chills and fever.  Eyes: Negative for visual disturbance.  Respiratory: Negative for chest tightness and shortness of breath.   Cardiovascular: Negative for chest pain and leg swelling.  Gastrointestinal: Negative for abdominal distention, abdominal pain and constipation.  Musculoskeletal: Negative for back pain and gait problem.  Skin: Negative for rash.  Neurological: Negative for light-headedness and headaches.  Psychiatric/Behavioral: Negative for agitation and behavioral  problems.  All other systems reviewed and are negative.   Per HPI unless specifically indicated above   Allergies as of 06/15/2019   No Known Allergies     Medication List       Accurate as of June 15, 2019  9:44 AM. If you have any questions, ask your nurse or doctor.        aspirin 81 MG tablet Take 81 mg by mouth daily.   CALTRATE 600 PLUS-VIT D PO Take 1 tablet by mouth daily.   levothyroxine 50 MCG tablet Commonly known as: SYNTHROID TAKE 1 TABLET BY MOUTH ONCE DAILY BEFORE BREAKFAST   lisinopril 10 MG tablet Commonly known as: ZESTRIL Take 1 tablet (10 mg total) by mouth daily.   multivitamin tablet Take 1 tablet by mouth daily.   OMEGA 3 PO Take 2 g by mouth 2 (two) times daily.   rosuvastatin 20 MG tablet Commonly known as: CRESTOR Take 1 tablet (20 mg total) by mouth daily.        Objective:   BP 120/65   Pulse 60   Temp 98.4 F (36.9 C) (Temporal)   Ht _1  (1.473 m)   Wt 136 lb 12.8 oz (62.1 kg)   SpO2 98%   BMI 28.59 kg/m   Wt Readings from Last 3 Encounters:  06/15/19 136 lb 12.8 oz (62.1 kg)  06/21/18 150 lb (68 kg)  06/16/18 149 lb 9.6 oz (67.9 kg)    Physical Exam Vitals and nursing note reviewed.  Constitutional:      General: She is not in acute distress.    Appearance: She is  well-developed. She is not diaphoretic.  Eyes:     Conjunctiva/sclera: Conjunctivae normal.  Cardiovascular:     Rate and Rhythm: Normal rate and regular rhythm.     Heart sounds: Normal heart sounds. No murmur.  Pulmonary:     Effort: Pulmonary effort is normal. No respiratory distress.     Breath sounds: Normal breath sounds. No wheezing.  Abdominal:     General: Abdomen is flat. Bowel sounds are normal. There is no distension.     Tenderness: There is no abdominal tenderness.  Musculoskeletal:        General: No tenderness. Normal range of motion.  Lymphadenopathy:     Cervical: No cervical adenopathy.  Skin:    General: Skin is warm  and dry.     Findings: No rash.  Neurological:     Mental Status: She is alert and oriented to person, place, and time.     Coordination: Coordination normal.  Psychiatric:        Behavior: Behavior normal.       Assessment & Plan:   Problem List Items Addressed This Visit      Cardiovascular and Mediastinum   Hypertension   Relevant Medications   rosuvastatin (CRESTOR) 20 MG tablet   Other Relevant Orders   CBC with Differential/Platelet   CMP14+EGFR     Endocrine   Thyroid disease - Primary   Relevant Medications   levothyroxine (SYNTHROID) 50 MCG tablet   Other Relevant Orders   CBC with Differential/Platelet   TSH     Other   Hyperlipidemia   Relevant Medications   rosuvastatin (CRESTOR) 20 MG tablet   Other Relevant Orders   Lipid panel      Blood pressure looks great and will recheck thyroid and cholesterol today, continue current medication. Follow up plan: Return in about 6 months (around 12/13/2019), or if symptoms worsen or fail to improve, for Hypertension and thyroid recheck.  Counseling provided for all of the vaccine components Orders Placed This Encounter  Procedures  . CBC with Differential/Platelet  . CMP14+EGFR  . Lipid panel  . TSH    Caryl Pina, MD [facility] 06/15/2019, 9:44 AM

## 2019-06-16 LAB — CBC WITH DIFFERENTIAL/PLATELET
Basophils Absolute: 0 10*3/uL (ref 0.0–0.2)
Basos: 1 %
EOS (ABSOLUTE): 0 10*3/uL (ref 0.0–0.4)
Eos: 0 %
Hematocrit: 33.4 % — ABNORMAL LOW (ref 34.0–46.6)
Hemoglobin: 10.9 g/dL — ABNORMAL LOW (ref 11.1–15.9)
Immature Grans (Abs): 0 10*3/uL (ref 0.0–0.1)
Immature Granulocytes: 0 %
Lymphocytes Absolute: 1.1 10*3/uL (ref 0.7–3.1)
Lymphs: 21 %
MCH: 29.7 pg (ref 26.6–33.0)
MCHC: 32.6 g/dL (ref 31.5–35.7)
MCV: 91 fL (ref 79–97)
Monocytes Absolute: 0.7 10*3/uL (ref 0.1–0.9)
Monocytes: 13 %
Neutrophils Absolute: 3.6 10*3/uL (ref 1.4–7.0)
Neutrophils: 65 %
Platelets: 203 10*3/uL (ref 150–450)
RBC: 3.67 x10E6/uL — ABNORMAL LOW (ref 3.77–5.28)
RDW: 12.9 % (ref 11.7–15.4)
WBC: 5.4 10*3/uL (ref 3.4–10.8)

## 2019-06-16 LAB — CMP14+EGFR
ALT: 20 IU/L (ref 0–32)
AST: 28 IU/L (ref 0–40)
Albumin/Globulin Ratio: 2 (ref 1.2–2.2)
Albumin: 4.5 g/dL (ref 3.7–4.7)
Alkaline Phosphatase: 95 IU/L (ref 39–117)
BUN/Creatinine Ratio: 16 (ref 12–28)
BUN: 15 mg/dL (ref 8–27)
Bilirubin Total: 0.5 mg/dL (ref 0.0–1.2)
CO2: 23 mmol/L (ref 20–29)
Calcium: 9.8 mg/dL (ref 8.7–10.3)
Chloride: 101 mmol/L (ref 96–106)
Creatinine, Ser: 0.94 mg/dL (ref 0.57–1.00)
GFR calc Af Amer: 68 mL/min/{1.73_m2} (ref >59)
GFR calc non Af Amer: 59 mL/min/{1.73_m2} — ABNORMAL LOW (ref >59)
Globulin, Total: 2.2 g/dL (ref 1.5–4.5)
Glucose: 80 mg/dL (ref 65–99)
Potassium: 4.4 mmol/L (ref 3.5–5.2)
Sodium: 140 mmol/L (ref 134–144)
Total Protein: 6.7 g/dL (ref 6.0–8.5)

## 2019-06-16 LAB — LIPID PANEL
Chol/HDL Ratio: 2 ratio (ref 0.0–4.4)
Cholesterol, Total: 137 mg/dL (ref 100–199)
HDL: 69 mg/dL (ref >39)
LDL Chol Calc (NIH): 56 mg/dL (ref 0–99)
Triglycerides: 56 mg/dL (ref 0–149)
VLDL Cholesterol Cal: 12 mg/dL (ref 5–40)

## 2019-06-16 LAB — TSH: TSH: 2.41 u[IU]/mL (ref 0.450–4.500)

## 2019-06-21 ENCOUNTER — Telehealth: Payer: Self-pay | Admitting: Family Medicine

## 2019-06-21 NOTE — Telephone Encounter (Signed)
Aware of provider's note on lab results.

## 2019-06-23 ENCOUNTER — Ambulatory Visit (INDEPENDENT_AMBULATORY_CARE_PROVIDER_SITE_OTHER): Payer: Medicare Other | Admitting: *Deleted

## 2019-06-23 VITALS — BP 120/65 | Wt 136.0 lb

## 2019-06-23 DIAGNOSIS — Z0000 Encounter for general adult medical examination without abnormal findings: Secondary | ICD-10-CM | POA: Diagnosis not present

## 2019-06-23 NOTE — Progress Notes (Signed)
MEDICARE ANNUAL WELLNESS VISIT  06/23/2019  Telephone Visit Disclaimer This Medicare AWV was conducted by telephone due to national recommendations for restrictions regarding the COVID-19 Pandemic (e.g. social distancing).  I verified, using two identifiers, that I am speaking with Sherry Cooper or their authorized healthcare agent. I discussed the limitations, risks, security, and privacy concerns of performing an evaluation and management service by telephone and the potential availability of an in-person appointment in the future. The patient expressed understanding and agreed to proceed.   Subjective:  Sherry Cooper is a 79 y.o. female patient of Dettinger, Elige Radon, MD who had a Medicare Annual Wellness Visit today via telephone. Sherry Cooper is Retired and lives alone. she has 2  children. she reports that she is socially active and does interact with friends/family regularly. she is moderately physically active and enjoys watching basketball and doing word puzzles.  Patient Care Team: Dettinger, Elige Radon, MD as PCP - General (Family Medicine) Ernesto Rutherford, MD as Consulting Physician (Ophthalmology)  Advanced Directives 06/23/2019 06/21/2018 06/02/2017  Does Patient Have a Medical Advance Directive? No No No  Would patient like information on creating a medical advance directive? No - Patient declined Yes (MAU/Ambulatory/Procedural Areas - Information given) Yes (MAU/Ambulatory/Procedural Areas - Information given)    Hospital Utilization Over the Past 12 Months: # of hospitalizations or ER visits: 0 # of surgeries: 0  Review of Systems    Patient reports that her overall health is unchanged compared to last year.  General ROS: negative  Patient Reported Readings (BP, Pulse, CBG, Weight, etc) BP 120/65   Wt 136 lb (61.7 kg)   BMI 28.42 kg/m    Pain Assessment       Current Medications & Allergies (verified) Allergies as of 06/23/2019   No Known Allergies     Medication List         Accurate as of June 23, 2019 10:09 AM. If you have any questions, ask your nurse or doctor.        aspirin 81 MG tablet Take 81 mg by mouth daily.   CALTRATE 600 PLUS-VIT D PO Take 1 tablet by mouth daily.   levothyroxine 50 MCG tablet Commonly known as: SYNTHROID TAKE 1 TABLET BY MOUTH ONCE DAILY BEFORE BREAKFAST   lisinopril 10 MG tablet Commonly known as: ZESTRIL Take 1 tablet (10 mg total) by mouth daily.   multivitamin tablet Take 1 tablet by mouth daily.   OMEGA 3 PO Take 2 g by mouth 2 (two) times daily.   rosuvastatin 20 MG tablet Commonly known as: CRESTOR Take 1 tablet (20 mg total) by mouth daily.       History (reviewed): Past Medical History:  Diagnosis Date  . Arthritis   . Cataract    Bilateral  Surgery on both cant remember dates   . Hyperlipidemia   . Hypertension   . Thyroid disease    Past Surgical History:  Procedure Laterality Date  . ABDOMINAL HYSTERECTOMY  1987  . CHOLECYSTECTOMY  05/31/2002   laparoscopic  . DILATION AND CURETTAGE OF UTERUS  1987  . EYE SURGERY Bilateral    cataracts removed    Family History  Problem Relation Age of Onset  . Cancer Sister        uterine and breast and lung  . Diabetes Brother   . Diabetes Sister   . Congestive Heart Failure Father   . COPD Brother   . Migraines Daughter   . Colon cancer Neg Hx   .  Stomach cancer Neg Hx    Social History   Socioeconomic History  . Marital status: Widowed    Spouse name: Not on file  . Number of children: 2  . Years of education: 2  . Highest education level: 12th grade  Occupational History  . Occupation: Retired     Comment: Barrister's clerk   Tobacco Use  . Smoking status: Never Smoker  . Smokeless tobacco: Never Used  Substance and Sexual Activity  . Alcohol use: No  . Drug use: No  . Sexual activity: Not Currently  Other Topics Concern  . Not on file  Social History Narrative  . Not on file   Social Determinants of Health    Financial Resource Strain:   . Difficulty of Paying Living Expenses: Not on file  Food Insecurity:   . Worried About Programme researcher, broadcasting/film/video in the Last Year: Not on file  . Ran Out of Food in the Last Year: Not on file  Transportation Needs:   . Lack of Transportation (Medical): Not on file  . Lack of Transportation (Non-Medical): Not on file  Physical Activity:   . Days of Exercise per Week: Not on file  . Minutes of Exercise per Session: Not on file  Stress:   . Feeling of Stress : Not on file  Social Connections:   . Frequency of Communication with Friends and Family: Not on file  . Frequency of Social Gatherings with Friends and Family: Not on file  . Attends Religious Services: Not on file  . Active Member of Clubs or Organizations: Not on file  . Attends Banker Meetings: Not on file  . Marital Status: Not on file    Activities of Daily Living In your present state of health, do you have any difficulty performing the following activities: 06/23/2019  Hearing? N  Vision? N  Difficulty concentrating or making decisions? N  Walking or climbing stairs? N  Dressing or bathing? N  Doing errands, shopping? N  Preparing Food and eating ? N  Using the Toilet? N  In the past six months, have you accidently leaked urine? N  Do you have problems with loss of bowel control? N  Managing your Medications? N  Managing your Finances? N  Housekeeping or managing your Housekeeping? N  Some recent data might be hidden    Patient Education/ Literacy    Exercise Current Exercise Habits: Home exercise routine, Type of exercise: walking;Other - see comments(stairs and stretching), Time (Minutes): 30, Frequency (Times/Week): 7, Weekly Exercise (Minutes/Week): 210, Intensity: Mild, Exercise limited by: None identified  Diet Patient reports consuming 2 meals a day and 1 snack(s) a day Patient reports that her primary diet is: Regular Patient reports that she does have regular  access to food.   Depression Screen PHQ 2/9 Scores 06/23/2019 06/15/2019 06/21/2018 06/16/2018 11/25/2017 06/02/2017 05/28/2017  PHQ - 2 Score 0 0 0 0 0 0 0     Fall Risk Fall Risk  06/23/2019 06/15/2019 06/21/2018 06/16/2018 11/25/2017  Falls in the past year? 0 0 0 0 No     Objective:  Sherry Cooper seemed alert and oriented and she participated appropriately during our telephone visit.  Blood Pressure Weight BMI  BP Readings from Last 3 Encounters:  06/23/19 120/65  06/15/19 120/65  06/21/18 111/63   Wt Readings from Last 3 Encounters:  06/23/19 136 lb (61.7 kg)  06/15/19 136 lb 12.8 oz (62.1 kg)  06/21/18 150 lb (68 kg)  BMI Readings from Last 1 Encounters:  06/23/19 28.42 kg/m    *Unable to obtain current vital signs, weight, and BMI due to telephone visit type  Hearing/Vision  . Sherry Cooper did not seem to have difficulty with hearing/understanding during the telephone conversation . Reports that she has had a formal eye exam by an eye care professional within the past year . Reports that she has not had a formal hearing evaluation within the past year *Unable to fully assess hearing and vision during telephone visit type  Cognitive Function: 6CIT Screen 06/23/2019  What Year? 0 points  What month? 0 points  What time? 0 points  Count back from 20 0 points  Months in reverse 0 points  Repeat phrase 0 points  Total Score 0   (Normal:0-7, Significant for Dysfunction: >8)  Normal Cognitive Function Screening: Yes   Immunization & Health Maintenance Record Immunization History  Administered Date(s) Administered  . Fluad Quad(high Dose 65+) 02/04/2019  . Influenza, High Dose Seasonal PF 02/01/2016, 02/01/2016, 02/10/2017, 02/10/2017, 02/22/2018, 02/22/2018  . Influenza,inj,Quad PF,6+ Mos 01/24/2013  . Influenza-Unspecified 02/28/2004, 01/31/2005, 01/31/2006, 02/01/2007, 05/10/2008, 01/19/2009, 03/25/2010, 02/04/2011, 02/11/2012, 01/25/2013, 02/01/2014, 03/06/2015  . Pneumococcal  Conjugate-13 12/22/2013  . Pneumococcal Polysaccharide-23 02/11/2012  . Pneumococcal-Unspecified 11/20/2010  . Td 03/09/2007, 02/04/2011  . Tdap 07/21/2006, 02/04/2011  . Zoster 04/21/2006, 04/20/2012  . Zoster Recombinat (Shingrix) 09/09/2017, 11/25/2017    Health Maintenance  Topic Date Due  . TETANUS/TDAP  02/03/2021  . INFLUENZA VACCINE  Completed  . DEXA SCAN  Completed  . PNA vac Low Risk Adult  Completed       Assessment  This is a routine wellness examination for Sherry Cooper.  Health Maintenance: Due or Overdue There are no preventive care reminders to display for this patient.  Sherry Cooper does not need a referral for [employer] Assistance: Care Management:   no Social Work:    no Prescription Assistance:  no Nutrition/Diabetes Education:  no   Plan:  Personalized Goals Goals Addressed            This Visit's Progress   . Have 3 meals a day   On track    Try to eat 3 meals a day including fruits and vegetables. Low fat.cholesterol diet handouts given     . Prevent falls   On track    Stay active       Personalized Health Maintenance & Screening Recommendations  declined advanced directives , otherwise up to date  Lung Cancer Screening Recommended: no (Low Dose CT Chest recommended if Age 5-80 years, 30 pack-year currently smoking OR have quit w/in past 15 years) Hepatitis C Screening recommended: no HIV Screening recommended: no  Advanced Directives: Written information was not prepared per patient's request.  Referrals & Orders No orders of the defined types were placed in this encounter.   Follow-up Plan . Follow-up with Dettinger, Elige Radon, MD as planned    I have personally reviewed and noted the following in the patient's chart:   . Medical and social history . Use of alcohol, tobacco or illicit drugs  . Current medications and supplements . Functional ability and status . Nutritional status . Physical activity . Advanced  directives . List of other physicians . Hospitalizations, surgeries, and ER visits in previous 12 months . Vitals . Screenings to include cognitive, depression, and falls . Referrals and appointments  In addition, I have reviewed and discussed with Sherry Cooper certain preventive protocols, quality metrics, and best practice recommendations. A  written personalized care plan for preventive services as well as general preventive health recommendations is available and can be mailed to the patient at her request.      Sherry Cooper  06/23/2019

## 2019-08-25 IMAGING — MG 2D DIGITAL SCREENING BILATERAL MAMMOGRAM WITH CAD AND ADJUNCT TO
9 of 13 series · 9 of 29 positions shown · non-contrast
Comparison: Previous exam(s).

CLINICAL DATA: Screening.

EXAM:
2D DIGITAL SCREENING BILATERAL MAMMOGRAM WITH CAD AND ADJUNCT TOMO

[R CC (1 of 2)]
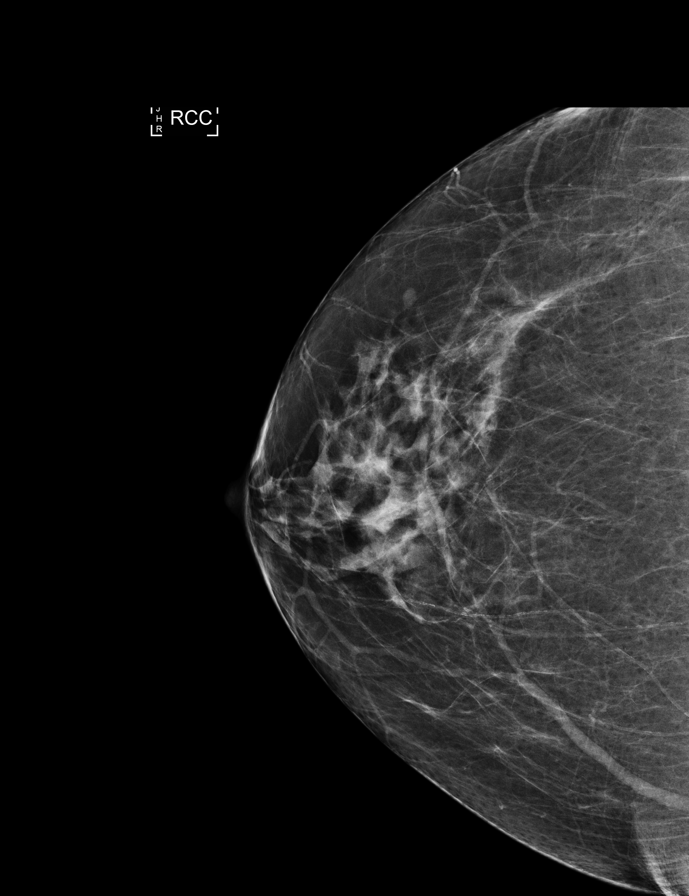

[L CC]
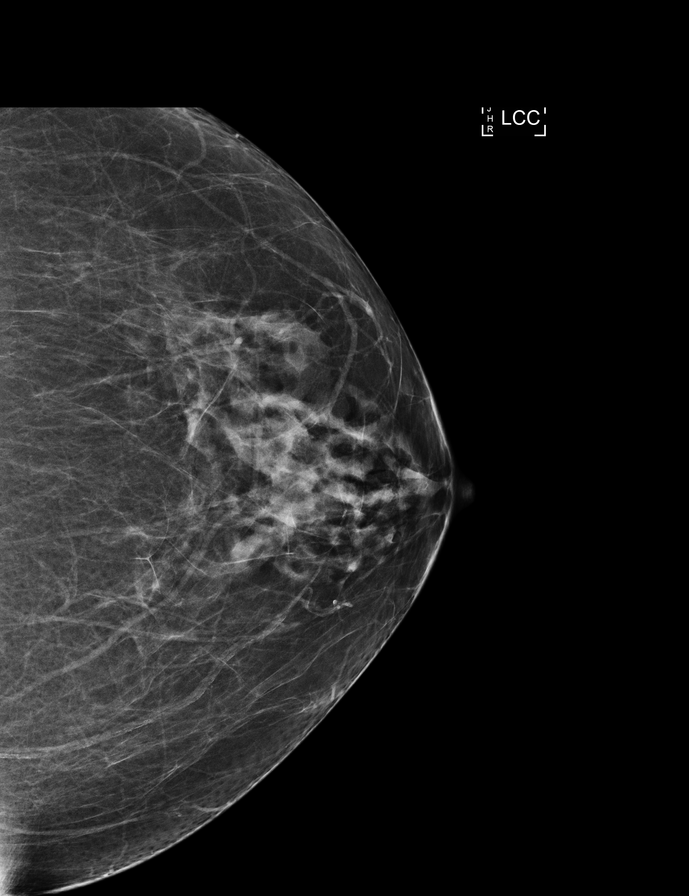

[L MLO synth-2D]
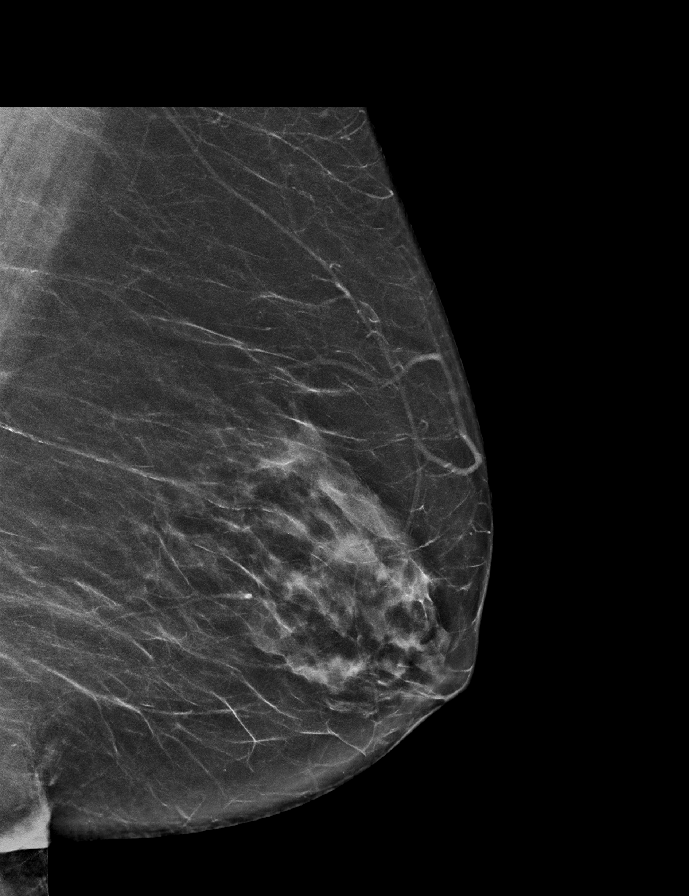

[L CC synth-2D]
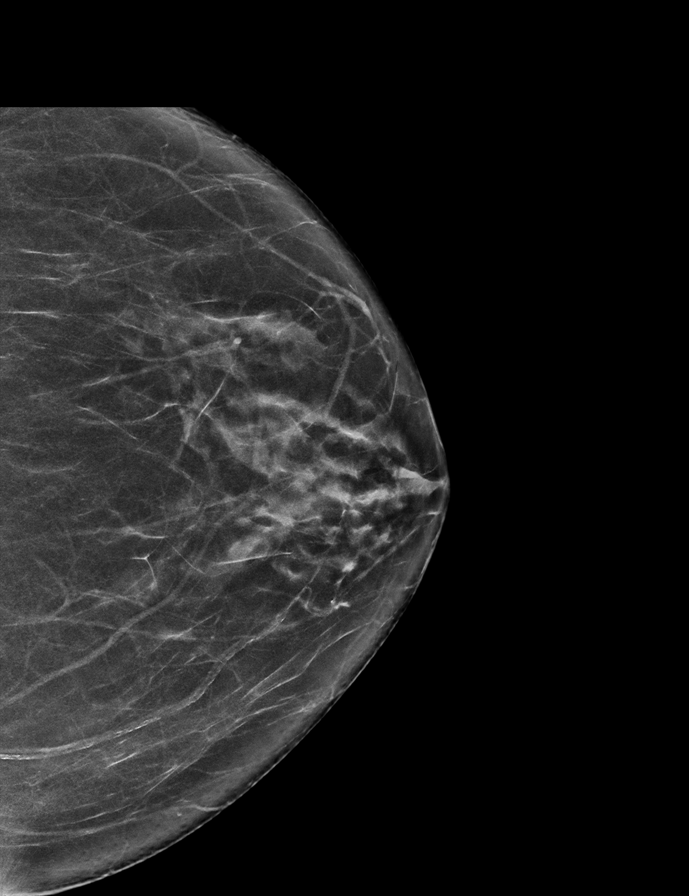

[L MLO]
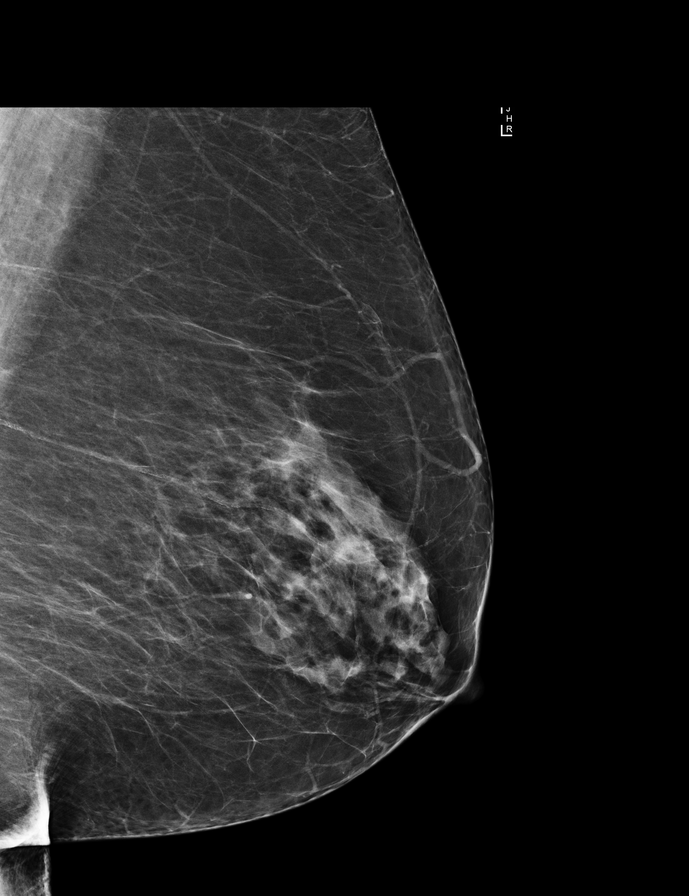

[R CC synth-2D]
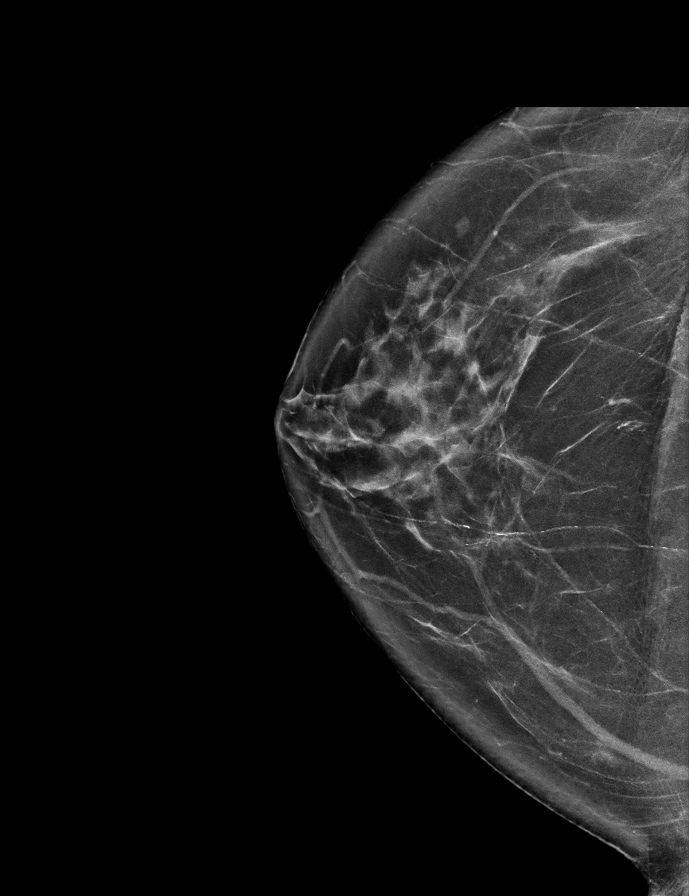

[R MLO synth-2D]
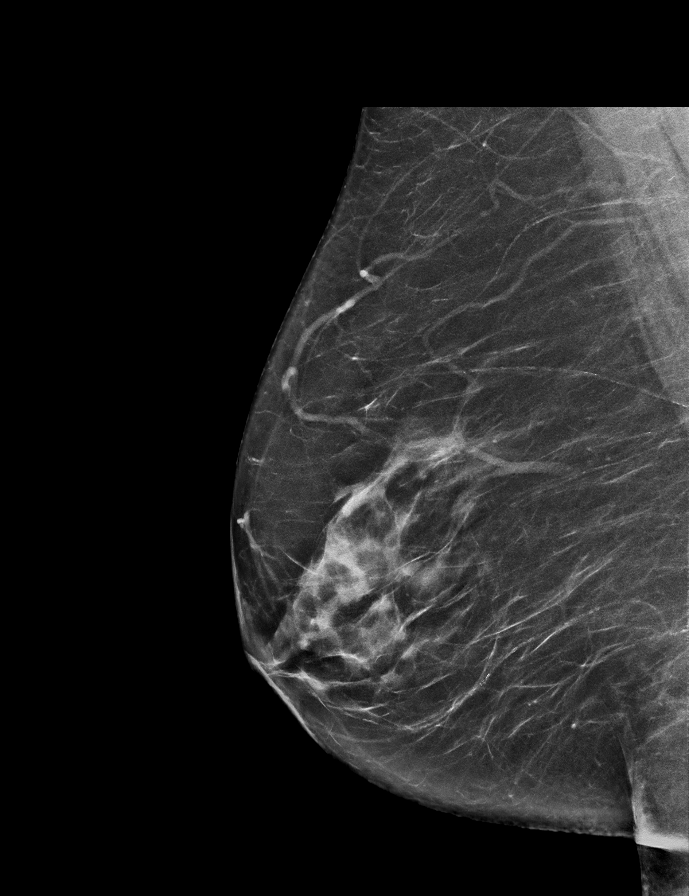

[R MLO]
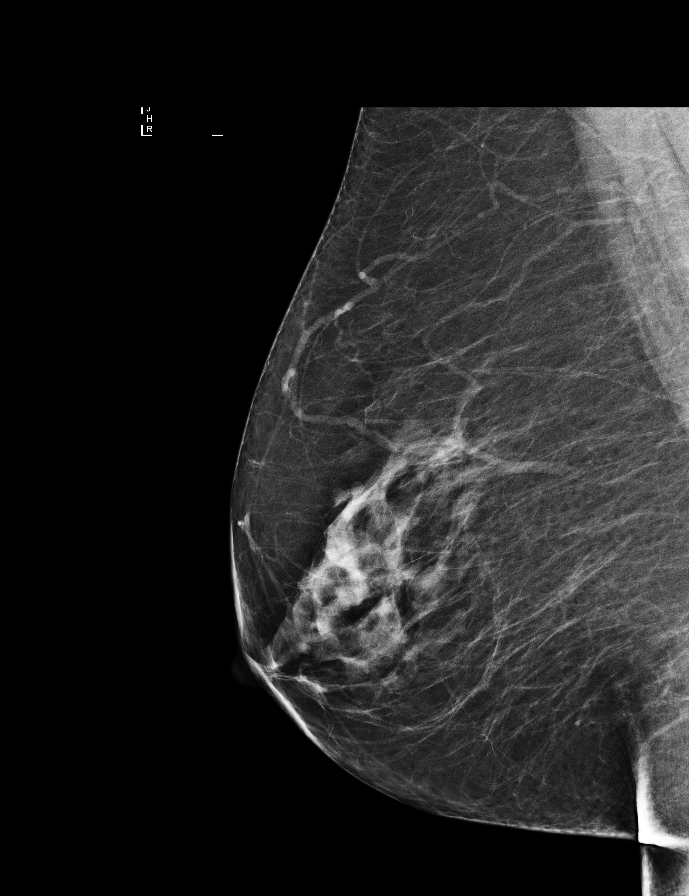

[R CC (2 of 2)]
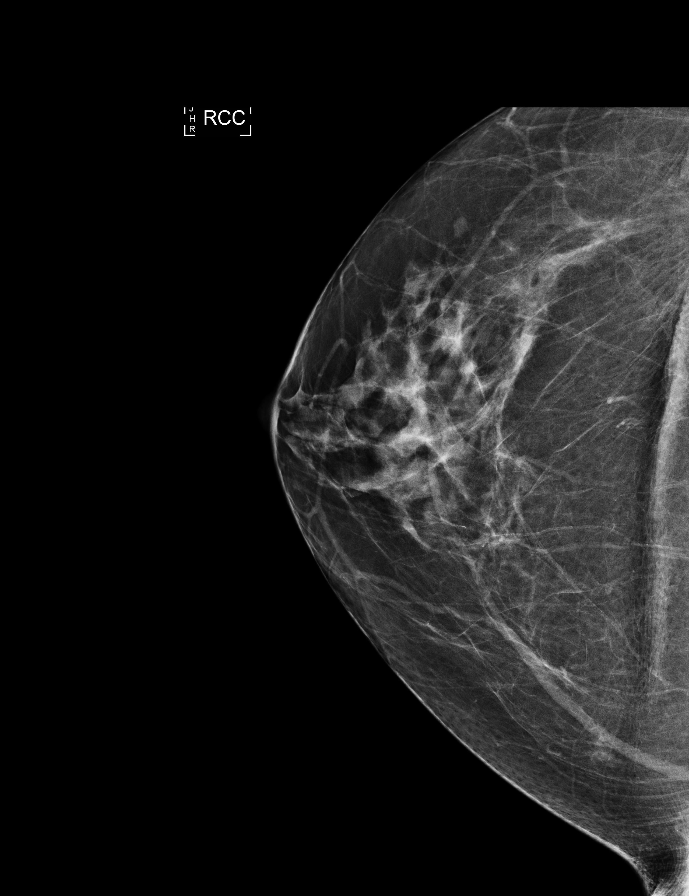

[9 of 29 positions shown; findings below may reference images not displayed]

ACR Breast Density Category b: There are scattered areas of
fibroglandular density.
FINDINGS: There are no findings suspicious for malignancy. Images were
processed with CAD.
IMPRESSION: No mammographic evidence of malignancy. A result letter of this
screening mammogram will be mailed directly to the patient.

RECOMMENDATION:
Screening mammogram in one year. (Code:97-6-RS4)

BI-RADS CATEGORY  1: Negative.

## 2019-09-15 ENCOUNTER — Other Ambulatory Visit: Payer: Self-pay | Admitting: Family Medicine

## 2019-09-15 DIAGNOSIS — Z1231 Encounter for screening mammogram for malignant neoplasm of breast: Secondary | ICD-10-CM

## 2019-12-14 ENCOUNTER — Other Ambulatory Visit: Payer: Self-pay

## 2019-12-14 ENCOUNTER — Ambulatory Visit: Payer: Medicare Other | Admitting: Family Medicine

## 2019-12-14 ENCOUNTER — Encounter: Payer: Self-pay | Admitting: Family Medicine

## 2019-12-14 VITALS — BP 115/64 | HR 76 | Temp 97.9°F | Ht <= 58 in | Wt 136.0 lb

## 2019-12-14 DIAGNOSIS — I10 Essential (primary) hypertension: Secondary | ICD-10-CM | POA: Diagnosis not present

## 2019-12-14 DIAGNOSIS — E7800 Pure hypercholesterolemia, unspecified: Secondary | ICD-10-CM

## 2019-12-14 DIAGNOSIS — E079 Disorder of thyroid, unspecified: Secondary | ICD-10-CM | POA: Diagnosis not present

## 2019-12-14 NOTE — Progress Notes (Signed)
BP 115/64   Pulse 76   Temp 97.9 F (36.6 C)   Ht _0  (1.473 m)   Wt 136 lb (61.7 kg)   SpO2 98%   BMI 28.42 kg/m    Subjective:   Patient ID: Sherry Cooper, female    DOB: Sep 14, 1940, 79 y.o.   MRN: [id number]  HPI: Sherry Cooper is a 79 y.o. female presenting on 12/14/2019 for Medical Management of Chronic Issues and Hypothyroidism   HPI Hypothyroidism recheck Patient is coming in for thyroid recheck today as well. They deny any issues with hair changes or heat or cold problems or diarrhea or constipation. They deny any chest pain or palpitations. They are currently on levothyroxine 50 micrograms   Hypertension Patient is currently on lisinopril, and their blood pressure today is 115/64. Patient denies any lightheadedness or dizziness. Patient denies headaches, blurred vision, chest pains, shortness of breath, or weakness. Denies any side effects from medication and is content with current medication.   Hyperlipidemia Patient is coming in for recheck of his hyperlipidemia. The patient is currently taking fish oil and Crestor. They deny any issues with myalgias or history of liver damage from it. They deny any focal numbness or weakness or chest pain.   Relevant past medical, surgical, family and social history reviewed and updated as indicated. Interim medical history since our last visit reviewed. Allergies and medications reviewed and updated.  Review of Systems  Constitutional: Negative for chills and fever.  Eyes: Negative for visual disturbance.  Respiratory: Negative for chest tightness and shortness of breath.   Cardiovascular: Negative for chest pain and leg swelling.  Skin: Negative for rash.  Neurological: Negative for light-headedness and headaches.  Psychiatric/Behavioral: Negative for agitation and behavioral problems.  All other systems reviewed and are negative.   Per HPI unless specifically indicated above   Allergies as of 12/14/2019   No Known  Allergies     Medication List       Accurate as of December 14, 2019  9:43 AM. If you have any questions, ask your nurse or doctor.        aspirin 81 MG tablet Take 81 mg by mouth daily.   CALTRATE 600 PLUS-VIT D PO Take 1 tablet by mouth daily.   levothyroxine 50 MCG tablet Commonly known as: SYNTHROID TAKE 1 TABLET BY MOUTH ONCE DAILY BEFORE BREAKFAST   lisinopril 10 MG tablet Commonly known as: ZESTRIL Take 1 tablet (10 mg total) by mouth daily.   multivitamin tablet Take 1 tablet by mouth daily.   OMEGA 3 PO Take 2 g by mouth 2 (two) times daily.   rosuvastatin 20 MG tablet Commonly known as: CRESTOR Take 1 tablet (20 mg total) by mouth daily.        Objective:   BP 115/64   Pulse 76   Temp 97.9 F (36.6 C)   Ht _1  (1.473 m)   Wt 136 lb (61.7 kg)   SpO2 98%   BMI 28.42 kg/m   Wt Readings from Last 3 Encounters:  12/14/19 136 lb (61.7 kg)  06/23/19 136 lb (61.7 kg)  06/15/19 136 lb 12.8 oz (62.1 kg)    Physical Exam Vitals and nursing note reviewed.  Constitutional:      General: She is not in acute distress.    Appearance: She is well-developed. She is not diaphoretic.  Eyes:     Conjunctiva/sclera: Conjunctivae normal.  Cardiovascular:     Rate and Rhythm: Normal rate and regular  rhythm.     Heart sounds: Normal heart sounds. No murmur heard.   Pulmonary:     Effort: Pulmonary effort is normal. No respiratory distress.     Breath sounds: Normal breath sounds. No wheezing.  Musculoskeletal:        General: No tenderness. Normal range of motion.  Skin:    General: Skin is warm and dry.     Findings: No rash.  Neurological:     Mental Status: She is alert and oriented to person, place, and time.     Coordination: Coordination normal.  Psychiatric:        Behavior: Behavior normal.       Assessment & Plan:   Problem List Items Addressed This Visit      Cardiovascular and Mediastinum   Hypertension - Primary   Relevant Orders     CMP14+EGFR     Endocrine   Thyroid disease   Relevant Orders   TSH     Other   Hyperlipidemia   Relevant Orders   Lipid panel      Patient seems to be doing well, continue current medicine, no changes in dosing. Follow up plan: Return in about 6 months (around 06/15/2020), or if symptoms worsen or fail to improve, for Thyroid and cholesterol recheck.  Counseling provided for all of the vaccine components No orders of the defined types were placed in this encounter.   Caryl Pina, MD [facility] 12/14/2019, 9:43 AM

## 2019-12-15 LAB — CMP14+EGFR
ALT: 19 IU/L (ref 0–32)
AST: 31 IU/L (ref 0–40)
Albumin/Globulin Ratio: 1.7 (ref 1.2–2.2)
Albumin: 4.3 g/dL (ref 3.7–4.7)
Alkaline Phosphatase: 94 IU/L (ref 48–121)
BUN/Creatinine Ratio: 16 (ref 12–28)
BUN: 16 mg/dL (ref 8–27)
Bilirubin Total: 0.4 mg/dL (ref 0.0–1.2)
CO2: 23 mmol/L (ref 20–29)
Calcium: 10.3 mg/dL (ref 8.7–10.3)
Chloride: 100 mmol/L (ref 96–106)
Creatinine, Ser: 0.98 mg/dL (ref 0.57–1.00)
GFR calc Af Amer: 64 mL/min/{1.73_m2} (ref >59)
GFR calc non Af Amer: 55 mL/min/{1.73_m2} — ABNORMAL LOW (ref >59)
Globulin, Total: 2.5 g/dL (ref 1.5–4.5)
Glucose: 78 mg/dL (ref 65–99)
Potassium: 4.8 mmol/L (ref 3.5–5.2)
Sodium: 138 mmol/L (ref 134–144)
Total Protein: 6.8 g/dL (ref 6.0–8.5)

## 2019-12-15 LAB — TSH: TSH: 2.68 u[IU]/mL (ref 0.450–4.500)

## 2019-12-15 LAB — LIPID PANEL
Chol/HDL Ratio: 2.2 ratio (ref 0.0–4.4)
Cholesterol, Total: 147 mg/dL (ref 100–199)
HDL: 66 mg/dL (ref >39)
LDL Chol Calc (NIH): 69 mg/dL (ref 0–99)
Triglycerides: 56 mg/dL (ref 0–149)
VLDL Cholesterol Cal: 12 mg/dL (ref 5–40)

## 2019-12-16 ENCOUNTER — Telehealth: Payer: Self-pay | Admitting: Family Medicine

## 2019-12-16 NOTE — Telephone Encounter (Signed)
Advised patient medication is the same med just brand name.

## 2020-01-09 ENCOUNTER — Telehealth: Payer: Self-pay | Admitting: Family Medicine

## 2020-01-09 NOTE — Telephone Encounter (Signed)
Returned patients call- she was calling regarding another patient and questions about medication.  Aware that the patient needs to call their provider.  Patient verbalizes understanding.

## 2020-02-16 ENCOUNTER — Telehealth: Payer: Self-pay | Admitting: Family Medicine

## 2020-02-16 MED ORDER — LISINOPRIL 10 MG PO TABS
10.0000 mg | ORAL_TABLET | Freq: Every day | ORAL | 0 refills | Status: DC
Start: 2020-02-16 — End: 2020-05-17

## 2020-02-16 NOTE — Telephone Encounter (Signed)
Patient advised that Lisinopril will be sent to [facility]

## 2020-02-16 NOTE — Telephone Encounter (Signed)
Pt stated that she needed her lisinopril refilled and sent to the [facility] pharmacy

## 2020-02-21 ENCOUNTER — Other Ambulatory Visit: Payer: Self-pay

## 2020-02-21 ENCOUNTER — Ambulatory Visit (INDEPENDENT_AMBULATORY_CARE_PROVIDER_SITE_OTHER): Payer: Medicare Other

## 2020-02-21 DIAGNOSIS — Z23 Encounter for immunization: Secondary | ICD-10-CM | POA: Diagnosis not present

## 2020-02-22 ENCOUNTER — Ambulatory Visit
Admission: RE | Admit: 2020-02-22 | Discharge: 2020-02-22 | Disposition: A | Discharge status: Home / Self Care | Payer: Medicare Other | Source: Ambulatory Visit | Attending: Family Medicine | Admitting: Family Medicine

## 2020-02-22 DIAGNOSIS — Z961 Presence of intraocular lens: Secondary | ICD-10-CM | POA: Diagnosis not present

## 2020-02-22 DIAGNOSIS — H40013 Open angle with borderline findings, low risk, bilateral: Secondary | ICD-10-CM | POA: Diagnosis not present

## 2020-02-22 DIAGNOSIS — Z1231 Encounter for screening mammogram for malignant neoplasm of breast: Secondary | ICD-10-CM | POA: Diagnosis not present

## 2020-02-22 DIAGNOSIS — H43813 Vitreous degeneration, bilateral: Secondary | ICD-10-CM | POA: Diagnosis not present

## 2020-02-28 ENCOUNTER — Other Ambulatory Visit: Payer: Self-pay | Admitting: Family Medicine

## 2020-02-28 DIAGNOSIS — R928 Other abnormal and inconclusive findings on diagnostic imaging of breast: Secondary | ICD-10-CM

## 2020-02-29 ENCOUNTER — Other Ambulatory Visit: Payer: Self-pay

## 2020-02-29 ENCOUNTER — Ambulatory Visit
Admission: RE | Admit: 2020-02-29 | Discharge: 2020-02-29 | Disposition: A | Discharge status: Home / Self Care | Payer: Medicare Other | Source: Ambulatory Visit | Attending: Family Medicine | Admitting: Family Medicine

## 2020-02-29 DIAGNOSIS — R928 Other abnormal and inconclusive findings on diagnostic imaging of breast: Secondary | ICD-10-CM

## 2020-02-29 DIAGNOSIS — N6489 Other specified disorders of breast: Secondary | ICD-10-CM | POA: Diagnosis not present

## 2020-03-07 ENCOUNTER — Other Ambulatory Visit: Payer: Medicare Other

## 2020-05-16 ENCOUNTER — Other Ambulatory Visit: Payer: Self-pay | Admitting: Family Medicine

## 2020-06-14 ENCOUNTER — Telehealth: Payer: Self-pay

## 2020-06-14 ENCOUNTER — Encounter: Payer: Self-pay | Admitting: Family Medicine

## 2020-06-14 ENCOUNTER — Other Ambulatory Visit: Payer: Self-pay

## 2020-06-14 ENCOUNTER — Ambulatory Visit: Payer: Medicare Other | Admitting: Family Medicine

## 2020-06-14 VITALS — BP 93/47 | HR 82 | Ht <= 58 in | Wt 139.0 lb

## 2020-06-14 DIAGNOSIS — E7800 Pure hypercholesterolemia, unspecified: Secondary | ICD-10-CM

## 2020-06-14 DIAGNOSIS — D649 Anemia, unspecified: Secondary | ICD-10-CM | POA: Insufficient documentation

## 2020-06-14 DIAGNOSIS — I10 Essential (primary) hypertension: Secondary | ICD-10-CM | POA: Diagnosis not present

## 2020-06-14 DIAGNOSIS — E079 Disorder of thyroid, unspecified: Secondary | ICD-10-CM

## 2020-06-14 LAB — CMP14+EGFR
ALT: 17 IU/L (ref 0–32)
AST: 30 IU/L (ref 0–40)
Albumin/Globulin Ratio: 1.8 (ref 1.2–2.2)
Albumin: 4.3 g/dL (ref 3.7–4.7)
Alkaline Phosphatase: 95 IU/L (ref 44–121)
BUN/Creatinine Ratio: 13 (ref 12–28)
BUN: 13 mg/dL (ref 8–27)
Bilirubin Total: 0.4 mg/dL (ref 0.0–1.2)
CO2: 22 mmol/L (ref 20–29)
Calcium: 9.7 mg/dL (ref 8.7–10.3)
Chloride: 101 mmol/L (ref 96–106)
Creatinine, Ser: 0.97 mg/dL (ref 0.57–1.00)
GFR calc Af Amer: 65 mL/min/{1.73_m2} (ref >59)
GFR calc non Af Amer: 56 mL/min/{1.73_m2} — ABNORMAL LOW (ref >59)
Globulin, Total: 2.4 g/dL (ref 1.5–4.5)
Glucose: 84 mg/dL (ref 65–99)
Potassium: 4.4 mmol/L (ref 3.5–5.2)
Sodium: 140 mmol/L (ref 134–144)
Total Protein: 6.7 g/dL (ref 6.0–8.5)

## 2020-06-14 LAB — CBC WITH DIFFERENTIAL/PLATELET
Basophils Absolute: 0 10*3/uL (ref 0.0–0.2)
Basos: 1 %
EOS (ABSOLUTE): 0.1 10*3/uL (ref 0.0–0.4)
Eos: 1 %
Hematocrit: 35.5 % (ref 34.0–46.6)
Hemoglobin: 11.8 g/dL (ref 11.1–15.9)
Immature Grans (Abs): 0 10*3/uL (ref 0.0–0.1)
Immature Granulocytes: 0 %
Lymphocytes Absolute: 1.8 10*3/uL (ref 0.7–3.1)
Lymphs: 26 %
MCH: 29.6 pg (ref 26.6–33.0)
MCHC: 33.2 g/dL (ref 31.5–35.7)
MCV: 89 fL (ref 79–97)
Monocytes Absolute: 0.7 10*3/uL (ref 0.1–0.9)
Monocytes: 10 %
Neutrophils Absolute: 4.3 10*3/uL (ref 1.4–7.0)
Neutrophils: 62 %
Platelets: 218 10*3/uL (ref 150–450)
RBC: 3.98 x10E6/uL (ref 3.77–5.28)
RDW: 12.9 % (ref 11.7–15.4)
WBC: 7 10*3/uL (ref 3.4–10.8)

## 2020-06-14 LAB — LIPID PANEL
Chol/HDL Ratio: 2 ratio (ref 0.0–4.4)
Cholesterol, Total: 138 mg/dL (ref 100–199)
HDL: 68 mg/dL (ref >39)
LDL Chol Calc (NIH): 58 mg/dL (ref 0–99)
Triglycerides: 58 mg/dL (ref 0–149)
VLDL Cholesterol Cal: 12 mg/dL (ref 5–40)

## 2020-06-14 LAB — TSH: TSH: 1.86 u[IU]/mL (ref 0.450–4.500)

## 2020-06-14 MED ORDER — LISINOPRIL 5 MG PO TABS
5.0000 mg | ORAL_TABLET | Freq: Every day | ORAL | 3 refills | Status: DC
Start: 2020-06-14 — End: 2021-06-11

## 2020-06-14 MED ORDER — ROSUVASTATIN CALCIUM 20 MG PO TABS
20.0000 mg | ORAL_TABLET | Freq: Every day | ORAL | 3 refills | Status: DC
Start: 2020-06-14 — End: 2021-06-11

## 2020-06-14 MED ORDER — LEVOTHYROXINE SODIUM 50 MCG PO TABS
50.0000 ug | ORAL_TABLET | Freq: Every day | ORAL | 3 refills | Status: DC
Start: 2020-06-14 — End: 2021-06-11

## 2020-06-14 NOTE — Telephone Encounter (Signed)
Pt had visit with Dr Dettinger this morning and was told to call the office and let nurse know about a medication.  Pt takes Euthyrox 50mg  1x daily for her thyroid.

## 2020-06-14 NOTE — Telephone Encounter (Signed)
Okay yes we have done on her list, thanks for letting us know

## 2020-06-14 NOTE — Progress Notes (Signed)
BP (!) 93/47   Pulse 82   Ht _0  (1.473 m)   Wt 139 lb (63 kg)   SpO2 100%   BMI 29.05 kg/m    Subjective:   Patient ID: Sherry Cooper, female    DOB: [birth year], 80 y.o.   MRN: [id number]  HPI: Sherry Cooper is a 80 y.o. female presenting on 06/14/2020 for Medical Management of Chronic Issues, Hypothyroidism, and Hypertension   HPI Hypertension Patient is currently on lisinopril, and their blood pressure today is 93/47. Patient denies any lightheadedness or dizziness. Patient denies headaches, blurred vision, chest pains, shortness of breath, or weakness. Denies any side effects from medication and is content with current medication.   Hypothyroidism recheck Patient is coming in for thyroid recheck today as well. They deny any issues with hair changes or heat or cold problems or diarrhea or constipation. They deny any chest pain or palpitations. They are currently on levothyroxine 50 micrograms   Hyperlipidemia Patient is coming in for recheck of his hyperlipidemia. The patient is currently taking Crestor and fish oil. They deny any issues with myalgias or history of liver damage from it. They deny any focal numbness or weakness or chest pain.   Patient has anemia and is coming in for recheck.  She denies any lightheadedness dizziness chest pain or palpitations.  Relevant past medical, surgical, family and social history reviewed and updated as indicated. Interim medical history since our last visit reviewed. Allergies and medications reviewed and updated.  Review of Systems  Constitutional: Negative for chills and fever.  HENT: Negative for congestion, ear discharge and ear pain.   Eyes: Negative for redness and visual disturbance.  Respiratory: Negative for chest tightness and shortness of breath.   Cardiovascular: Negative for chest pain and leg swelling.  Genitourinary: Negative for difficulty urinating and dysuria.  Musculoskeletal: Negative for back pain and gait  problem.  Skin: Negative for rash.  Neurological: Negative for dizziness, weakness, light-headedness and headaches.  Psychiatric/Behavioral: Negative for agitation and behavioral problems.  All other systems reviewed and are negative.   Per HPI unless specifically indicated above   Allergies as of 06/14/2020   No Known Allergies     Medication List       Accurate as of June 14, 2020  9:23 AM. If you have any questions, ask your nurse or doctor.        aspirin 81 MG tablet Take 81 mg by mouth daily.   CALTRATE 600 PLUS-VIT D PO Take 1 tablet by mouth daily.   levothyroxine 50 MCG tablet Commonly known as: SYNTHROID TAKE 1 TABLET BY MOUTH ONCE DAILY BEFORE BREAKFAST   lisinopril 10 MG tablet Commonly known as: ZESTRIL Take 1 tablet by mouth once daily   multivitamin tablet Take 1 tablet by mouth daily.   OMEGA 3 PO Take 2 g by mouth 2 (two) times daily.   rosuvastatin 20 MG tablet Commonly known as: CRESTOR Take 1 tablet by mouth once daily        Objective:   BP (!) 93/47   Pulse 82   Ht _1  (1.473 m)   Wt 139 lb (63 kg)   SpO2 100%   BMI 29.05 kg/m   Wt Readings from Last 3 Encounters:  06/14/20 139 lb (63 kg)  12/14/19 136 lb (61.7 kg)  06/23/19 136 lb (61.7 kg)    Physical Exam Vitals and nursing note reviewed.  Constitutional:      General: She is not in  acute distress.    Appearance: She is well-developed and well-nourished. She is not diaphoretic.  Eyes:     Extraocular Movements: EOM normal.     Conjunctiva/sclera: Conjunctivae normal.  Cardiovascular:     Rate and Rhythm: Normal rate and regular rhythm.     Pulses: Intact distal pulses.     Heart sounds: Normal heart sounds. No murmur heard.   Pulmonary:     Effort: Pulmonary effort is normal. No respiratory distress.     Breath sounds: Normal breath sounds. No wheezing.  Musculoskeletal:        General: No tenderness or edema. Normal range of motion.  Skin:    General:  Skin is warm and dry.     Findings: No rash.  Neurological:     Mental Status: She is alert and oriented to person, place, and time.     Coordination: Coordination normal.  Psychiatric:        Mood and Affect: Mood and affect normal.        Behavior: Behavior normal.       Assessment & Plan:   Problem List Items Addressed This Visit      Cardiovascular and Mediastinum   Hypertension - Primary   Relevant Medications   lisinopril (ZESTRIL) 5 MG tablet   rosuvastatin (CRESTOR) 20 MG tablet   Other Relevant Orders   CMP14+EGFR     Endocrine   Thyroid disease   Relevant Medications   levothyroxine (SYNTHROID) 50 MCG tablet   Other Relevant Orders   TSH     Other   Hyperlipidemia   Relevant Medications   lisinopril (ZESTRIL) 5 MG tablet   rosuvastatin (CRESTOR) 20 MG tablet   Other Relevant Orders   Lipid panel   Anemia   Relevant Orders   CBC with Differential/Platelet      Below her blood pressure medicine because she is running on the lower side, cut the lisinopril in half to 5 mg.  Check blood work today and see how she is doing. Follow up plan: Return in about 6 months (around 12/12/2020), or if symptoms worsen or fail to improve, for Hypertension and hypothyroidism hyperlipidemia anemia.  Counseling provided for all of the vaccine components No orders of the defined types were placed in this encounter.   Caryl Pina, MD [facility] 06/14/2020, 9:23 AM

## 2020-06-18 ENCOUNTER — Telehealth: Payer: Self-pay | Admitting: Family Medicine

## 2020-06-18 NOTE — Telephone Encounter (Signed)
Pt calling back to review labs, encounter was closed

## 2020-06-18 NOTE — Telephone Encounter (Signed)
Patient aware and verbalized understanding. °

## 2020-07-27 ENCOUNTER — Ambulatory Visit (INDEPENDENT_AMBULATORY_CARE_PROVIDER_SITE_OTHER): Payer: Medicare Other

## 2020-07-27 VITALS — Ht <= 58 in | Wt 139.0 lb

## 2020-07-27 DIAGNOSIS — Z0000 Encounter for general adult medical examination without abnormal findings: Secondary | ICD-10-CM

## 2020-07-27 NOTE — Patient Instructions (Signed)
Sherry Cooper , Thank you for taking time to come for your Medicare Wellness Visit. I appreciate your ongoing commitment to your health goals. Please review the following plan we discussed and let me know if I can assist you in the future.   Screening recommendations/referrals: Colonoscopy: Done 02/25/2011 - No repeat due to age Mammogram: Done 02/29/2020 - Repeat annually Bone Density: Done 06/02/2017 - Repeat every 2 years Recommended yearly ophthalmology/optometry visit for glaucoma screening and checkup Recommended yearly dental visit for hygiene and checkup  Vaccinations: Influenza vaccine: Done 02/21/2020 - Repeat annually Pneumococcal vaccine: Done 02/11/2012 & 12/22/2013 Tdap vaccine: Done 02/04/2011 - Repeat annually Shingles vaccine: Zostavax done 2013; Shingrix done 08/20/2017 & 11/25/2017   Covid-19: Done 05/17/19 & 06/11/19 - Due for booster  Advanced directives: Advance directive discussed with you today. I have provided a copy for you to complete at home and have notarized. Once this is complete please bring a copy in to our office so we can scan it into your chart.  Conditions/risks identified: Keep up the great work! Stay active, continue fall prevention and eating healthy.  Next appointment: Follow up in one year for your annual wellness visit    Preventive Care 65 Years and Older, Female Preventive care refers to lifestyle choices and visits with your health care provider that can promote health and wellness. What does preventive care include?  A yearly physical exam. This is also called an annual well check.  Dental exams once or twice a year.  Routine eye exams. Ask your health care provider how often you should have your eyes checked.  Personal lifestyle choices, including:  Daily care of your teeth and gums.  Regular physical activity.  Eating a healthy diet.  Avoiding tobacco and drug use.  Limiting alcohol use.  Practicing safe sex.  Taking low-dose aspirin  every day.  Taking vitamin and mineral supplements as recommended by your health care provider. What happens during an annual well check? The services and screenings done by your health care provider during your annual well check will depend on your age, overall health, lifestyle risk factors, and family history of disease. Counseling  Your health care provider may ask you questions about your:  Alcohol use.  Tobacco use.  Drug use.  Emotional well-being.  Home and relationship well-being.  Sexual activity.  Eating habits.  History of falls.  Memory and ability to understand (cognition).  Work and work Astronomer.  Reproductive health. Screening  You may have the following tests or measurements:  Height, weight, and BMI.  Blood pressure.  Lipid and cholesterol levels. These may be checked every 5 years, or more frequently if you are over 61 years old.  Skin check.  Lung cancer screening. You may have this screening every year starting at age 79 if you have a 30-pack-year history of smoking and currently smoke or have quit within the past 15 years.  Fecal occult blood test (FOBT) of the stool. You may have this test every year starting at age 54.  Flexible sigmoidoscopy or colonoscopy. You may have a sigmoidoscopy every 5 years or a colonoscopy every 10 years starting at age 73.  Hepatitis C blood test.  Hepatitis B blood test.  Sexually transmitted disease (STD) testing.  Diabetes screening. This is done by checking your blood sugar (glucose) after you have not eaten for a while (fasting). You may have this done every 1-3 years.  Bone density scan. This is done to screen for osteoporosis. You may  have this done starting at age 38.  Mammogram. This may be done every 1-2 years. Talk to your health care provider about how often you should have regular mammograms. Talk with your health care provider about your test results, treatment options, and if necessary,  the need for more tests. Vaccines  Your health care provider may recommend certain vaccines, such as:  Influenza vaccine. This is recommended every year.  Tetanus, diphtheria, and acellular pertussis (Tdap, Td) vaccine. You may need a Td booster every 10 years.  Zoster vaccine. You may need this after age 6.  Pneumococcal 13-valent conjugate (PCV13) vaccine. One dose is recommended after age 27.  Pneumococcal polysaccharide (PPSV23) vaccine. One dose is recommended after age 18. Talk to your health care provider about which screenings and vaccines you need and how often you need them. This information is not intended to replace advice given to you by your health care provider. Make sure you discuss any questions you have with your health care provider. Document Released: 05/04/2015 Document Revised: 12/26/2015 Document Reviewed: 02/06/2015 Elsevier Interactive Patient Education  2017 [employer] Prevention in the Home Falls can cause injuries. They can happen to people of all ages. There are many things you can do to make your home safe and to help prevent falls. What can I do on the outside of my home?  Regularly fix the edges of walkways and driveways and fix any cracks.  Remove anything that might make you trip as you walk through a door, such as a raised step or threshold.  Trim any bushes or trees on the path to your home.  Use bright outdoor lighting.  Clear any walking paths of anything that might make someone trip, such as rocks or tools.  Regularly check to see if handrails are loose or broken. Make sure that both sides of any steps have handrails.  Any raised decks and porches should have guardrails on the edges.  Have any leaves, snow, or ice cleared regularly.  Use sand or salt on walking paths during winter.  Clean up any spills in your garage right away. This includes oil or grease spills. What can I do in the bathroom?  Use night lights.  Install  grab bars by the toilet and in the tub and shower. Do not use towel bars as grab bars.  Use non-skid mats or decals in the tub or shower.  If you need to sit down in the shower, use a plastic, non-slip stool.  Keep the floor dry. Clean up any water that spills on the floor as soon as it happens.  Remove soap buildup in the tub or shower regularly.  Attach bath mats securely with double-sided non-slip rug tape.  Do not have throw rugs and other things on the floor that can make you trip. What can I do in the bedroom?  Use night lights.  Make sure that you have a light by your bed that is easy to reach.  Do not use any sheets or blankets that are too big for your bed. They should not hang down onto the floor.  Have a firm chair that has side arms. You can use this for support while you get dressed.  Do not have throw rugs and other things on the floor that can make you trip. What can I do in the kitchen?  Clean up any spills right away.  Avoid walking on wet floors.  Keep items that you use a lot in  easy-to-reach places.  If you need to reach something above you, use a strong step stool that has a grab bar.  Keep electrical cords out of the way.  Do not use floor polish or wax that makes floors slippery. If you must use wax, use non-skid floor wax.  Do not have throw rugs and other things on the floor that can make you trip. What can I do with my stairs?  Do not leave any items on the stairs.  Make sure that there are handrails on both sides of the stairs and use them. Fix handrails that are broken or loose. Make sure that handrails are as long as the stairways.  Check any carpeting to make sure that it is firmly attached to the stairs. Fix any carpet that is loose or worn.  Avoid having throw rugs at the top or bottom of the stairs. If you do have throw rugs, attach them to the floor with carpet tape.  Make sure that you have a light switch at the top of the stairs and  the bottom of the stairs. If you do not have them, ask someone to add them for you. What else can I do to help prevent falls?  Wear shoes that:  Do not have high heels.  Have rubber bottoms.  Are comfortable and fit you well.  Are closed at the toe. Do not wear sandals.  If you use a stepladder:  Make sure that it is fully opened. Do not climb a closed stepladder.  Make sure that both sides of the stepladder are locked into place.  Ask someone to hold it for you, if possible.  Clearly mark and make sure that you can see:  Any grab bars or handrails.  First and last steps.  Where the edge of each step is.  Use tools that help you move around (mobility aids) if they are needed. These include:  Canes.  Walkers.  Scooters.  Crutches.  Turn on the lights when you go into a dark area. Replace any light bulbs as soon as they burn out.  Set up your furniture so you have a clear path. Avoid moving your furniture around.  If any of your floors are uneven, fix them.  If there are any pets around you, be aware of where they are.  Review your medicines with your doctor. Some medicines can make you feel dizzy. This can increase your chance of falling. Ask your doctor what other things that you can do to help prevent falls. This information is not intended to replace advice given to you by your health care provider. Make sure you discuss any questions you have with your health care provider. Document Released: 02/01/2009 Document Revised: 09/13/2015 Document Reviewed: 05/12/2014 Elsevier Interactive Patient Education  2017 [employer].

## 2020-07-27 NOTE — Progress Notes (Addendum)
Subjective:   Sherry Cooper is a 80 y.o. female who presents for Medicare Annual (Subsequent) preventive examination.  Virtual Visit via Telephone Note  I connected with  Sherry Cooper on 07/27/20 at  4:15 PM EDT by telephone and verified that I am speaking with the correct person using two identifiers.  Location: Patient: Home Provider: [facility] Persons participating in the virtual visit: patient/Nurse Health Advisor   I discussed the limitations, risks, security and privacy concerns of performing an evaluation and management service by telephone and the availability of in person appointments. The patient expressed understanding and agreed to proceed.  Interactive audio and video telecommunications were attempted between this nurse and patient, however failed, due to patient having technical difficulties OR patient did not have access to video capability.  We continued and completed visit with audio only.  Some vital signs may be absent or patient reported.   Sherry Dobosz E Jahmiyah Dullea, LPN  Review of Systems     Cardiac Risk Factors include: advanced age (>1355men, 62>65 women);dyslipidemia;hypertension     Objective:    Today's Vitals   07/27/20 1614  Weight: 139 lb (63 kg)  Height: 4\' 10"  (1.473 m)   Body mass index is 29.05 kg/m.  Advanced Directives 07/27/2020 06/23/2019 06/21/2018 06/02/2017  Does Patient Have a Medical Advance Directive? No No No No  Would patient like information on creating a medical advance directive? Yes (MAU/Ambulatory/Procedural Areas - Information given) No - Patient declined Yes (MAU/Ambulatory/Procedural Areas - Information given) Yes (MAU/Ambulatory/Procedural Areas - Information given)    Current Medications (verified) Outpatient Encounter Medications as of 07/27/2020  Medication Sig  . aspirin 81 MG tablet Take 81 mg by mouth daily.  . Calcium-Vitamin D (CALTRATE 600 PLUS-VIT D PO) Take 1 tablet by mouth daily.  Marland Kitchen. levothyroxine (SYNTHROID) 50 MCG tablet Take 1  tablet (50 mcg total) by mouth daily before breakfast.  . lisinopril (ZESTRIL) 5 MG tablet Take 1 tablet (5 mg total) by mouth daily.  . Multiple Vitamin (MULTIVITAMIN) tablet Take 1 tablet by mouth daily.  . Omega-3 Fatty Acids (OMEGA 3 PO) Take 2 g by mouth 2 (two) times daily.  . rosuvastatin (CRESTOR) 20 MG tablet Take 1 tablet (20 mg total) by mouth daily.   No facility-administered encounter medications on file as of 07/27/2020.    Allergies (verified) Patient has no known allergies.   History: Past Medical History:  Diagnosis Date  . Arthritis   . Cataract    Bilateral  Surgery on both cant remember dates   . Hyperlipidemia   . Hypertension   . Thyroid disease    Past Surgical History:  Procedure Laterality Date  . ABDOMINAL HYSTERECTOMY  1987  . CHOLECYSTECTOMY  05/31/2002   laparoscopic  . DILATION AND CURETTAGE OF UTERUS  1987  . EYE SURGERY Bilateral    cataracts removed    Family History  Problem Relation Age of Onset  . Cancer Sister        uterine and breast and lung  . Diabetes Brother   . Diabetes Sister   . Congestive Heart Failure Father   . COPD Brother   . Migraines Daughter   . Colon cancer Neg Hx   . Stomach cancer Neg Hx    Social History   Socioeconomic History  . Marital status: Widowed    Spouse name: Not on file  . Number of children: 2  . Years of education: 8912  . Highest education level: 12th grade  Occupational History  .  Occupation: Retired     Comment: Barrister's clerk   Tobacco Use  . Smoking status: Never Smoker  . Smokeless tobacco: Never Used  Vaping Use  . Vaping Use: Never used  Substance and Sexual Activity  . Alcohol use: No  . Drug use: No  . Sexual activity: Not Currently  Other Topics Concern  . Not on file  Social History Narrative   Lives alone   Social Determinants of Health   Financial Resource Strain: Low Risk   . Difficulty of Paying Living Expenses: Not hard at all  Food Insecurity: No Food  Insecurity  . Worried About Programme researcher, broadcasting/film/video in the Last Year: Never true  . Ran Out of Food in the Last Year: Never true  Transportation Needs: No Transportation Needs  . Lack of Transportation (Medical): No  . Lack of Transportation (Non-Medical): No  Physical Activity: Sufficiently Active  . Days of Exercise per Week: 7 days  . Minutes of Exercise per Session: 30 min  Stress: No Stress Concern Present  . Feeling of Stress : Not at all  Social Connections: Moderately Isolated  . Frequency of Communication with Friends and Family: More than three times a week  . Frequency of Social Gatherings with Friends and Family: More than three times a week  . Attends Religious Services: More than 4 times per year  . Active Member of Clubs or Organizations: No  . Attends Banker Meetings: Never  . Marital Status: Widowed    Tobacco Counseling Counseling given: Not Answered   Clinical Intake:  Pre-visit preparation completed: Yes  Pain : No/denies pain     BMI - recorded: 29.06 Nutritional Status: BMI 25 -29 Overweight Nutritional Risks: None Diabetes: No  How often do you need to have someone help you when you read instructions, pamphlets, or other written materials from your doctor or pharmacy?: 1 - Never  Diabetic? No  Interpreter Needed?: No  Information entered by :: Nya Monds, LPN   Activities of Daily Living In your present state of health, do you have any difficulty performing the following activities: 07/27/2020  Hearing? N  Vision? N  Difficulty concentrating or making decisions? N  Walking or climbing stairs? N  Dressing or bathing? N  Doing errands, shopping? N  Preparing Food and eating ? N  Using the Toilet? N  In the past six months, have you accidently leaked urine? N  Do you have problems with loss of bowel control? N  Managing your Medications? N  Managing your Finances? N  Housekeeping or managing your Housekeeping? N  Some recent  data might be hidden    Patient Care Team: Dettinger, Elige Radon, MD as PCP - General (Family Medicine) Ernesto Rutherford, MD as Consulting Physician (Ophthalmology)  Indicate any recent Medical Services you may have received from other than [facility] providers in the past year (date may be approximate).     Assessment:   This is a routine wellness examination for Sherry Cooper.  Hearing/Vision screen  Hearing Screening   125Hz  250Hz  500Hz  1000Hz  2000Hz  3000Hz  4000Hz  6000Hz  8000Hz   Right ear:           Left ear:           Comments: No hearing complaints  Vision Screening Comments: Annual exams with Dr  Dietary issues and exercise activities discussed: Current Exercise Habits: Home exercise routine, Type of exercise: walking, Time (Minutes): 30, Frequency (Times/Week): 7, Weekly Exercise (Minutes/Week): 210, Intensity: Mild, Exercise  limited by: None identified  Goals    . Have 3 meals a day     Try to eat 3 meals a day including fruits and vegetables. Low fat.cholesterol diet handouts given     . Prevent falls     Stay active     . Weight (lb) < 125 lb (56.7 kg)      Depression Screen PHQ 2/9 Scores 07/27/2020 06/14/2020 12/14/2019 06/23/2019 06/15/2019 06/21/2018 06/16/2018  PHQ - 2 Score 0 0 0 0 0 0 0    Fall Risk Fall Risk  07/27/2020 06/14/2020 12/14/2019 06/23/2019 06/15/2019  Falls in the past year? 0 0 0 0 0  Number falls in past yr: 0 - - - -  Injury with Fall? 0 - - - -  Risk for fall due to : No Fall Risks - - - -  Follow up Falls prevention discussed - - - -    FALL RISK PREVENTION PERTAINING TO THE HOME:  Any stairs in or around the home? Yes  If so, are there any without handrails? Yes  Home free of loose throw rugs in walkways, pet beds, electrical cords, etc? Yes  Adequate lighting in your home to reduce risk of falls? Yes   ASSISTIVE DEVICES UTILIZED TO PREVENT FALLS:  Life alert? No  Use of a cane, walker or w/c? No  Grab bars in the bathroom? No  Shower chair or bench  in shower? No  Elevated toilet seat or a handicapped toilet? No   TIMED UP AND GO:  Was the test performed? No . Telephonic visit.  Cognitive Function: Normal cognitive status assessed by direct observation by this Nurse Health Advisor. No abnormalities found.   MMSE - Mini Mental State Exam 06/21/2018 06/02/2017  Orientation to time 5 5  Orientation to Place 5 5  Registration 3 3  Attention/ Calculation 5 5  Recall 3 3  Language- name 2 objects 2 2  Language- repeat 1 1  Language- follow 3 step command 3 3  Language- read & follow direction 1 1  Write a sentence 1 1  Copy design 1 1  Total score 30 30     6CIT Screen 06/23/2019  What Year? 0 points  What month? 0 points  What time? 0 points  Count back from 20 0 points  Months in reverse 0 points  Repeat phrase 0 points  Total Score 0    Immunizations Immunization History  Administered Date(s) Administered  . Fluad Quad(high Dose 65+) 02/04/2019, 02/21/2020  . Influenza, High Dose Seasonal PF 02/01/2016, 02/01/2016, 02/10/2017, 02/10/2017, 02/22/2018, 02/22/2018  . Influenza,inj,Quad PF,6+ Mos 01/24/2013  . Influenza-Unspecified 02/28/2004, 01/31/2005, 01/31/2006, 02/01/2007, 05/10/2008, 01/19/2009, 03/25/2010, 02/04/2011, 02/11/2012, 01/25/2013, 02/01/2014, 03/06/2015  . Moderna Sars-Covid-2 Vaccination 05/17/2019, 06/14/2019  . Pneumococcal Conjugate-13 12/22/2013  . Pneumococcal Polysaccharide-23 02/11/2012  . Pneumococcal-Unspecified 11/20/2010  . Td 03/09/2007, 02/04/2011  . Tdap 07/21/2006, 02/04/2011  . Zoster 04/21/2006, 04/20/2012  . Zoster Recombinat (Shingrix) 09/09/2017, 11/25/2017    TDAP status: Up to date  Flu Vaccine status: Up to date  Pneumococcal vaccine status: Up to date  Covid-19 vaccine status: Completed vaccines  Qualifies for Shingles Vaccine? Yes   Zostavax completed Yes   Shingrix Completed?: Yes  Screening Tests Health Maintenance  Topic Date Due  . COVID-19 Vaccine (3 -  Booster for Moderna series) 12/12/2019  . INFLUENZA VACCINE  11/19/2020  . TETANUS/TDAP  02/03/2021  . DEXA SCAN  Completed  . PNA vac Low Risk Adult  Completed  .  HPV VACCINES  Aged Out  . Hepatitis C Screening  Discontinued    Health Maintenance  Health Maintenance Due  Topic Date Due  . COVID-19 Vaccine (3 - Booster for Moderna series) 12/12/2019    Colorectal cancer screening: No longer required.   Mammogram status: Completed 02/29/2020. Repeat every year  Bone Density status: Completed 06/02/2017. Results reflect: Bone density results: OSTEOPENIA. Repeat every 2 years.  Lung Cancer Screening: (Low Dose CT Chest recommended if Age 7-80 years, 30 pack-year currently smoking OR have quit w/in 15years.) does not qualify.   Additional Screening:  Hepatitis C Screening: does not qualify  Vision Screening: Recommended annual ophthalmology exams for early detection of glaucoma and other disorders of the eye. Is the patient up to date with their annual eye exam?  Yes  Who is the provider or what is the name of the office in which the patient attends annual eye exams? [facility] If pt is not established with a provider, would they like to be referred to a provider to establish care? No .   Dental Screening: Recommended annual dental exams for proper oral hygiene  Community Resource Referral / Chronic Care Management: CRR required this visit?  No   CCM required this visit?  No      Plan:     I have personally reviewed and noted the following in the patient's chart:   . Medical and social history . Use of alcohol, tobacco or illicit drugs  . Current medications and supplements . Functional ability and status . Nutritional status . Physical activity . Advanced directives . List of other physicians . Hospitalizations, surgeries, and ER visits in previous 12 months . Vitals . Screenings to include cognitive, depression, and falls . Referrals and appointments  In addition,  I have reviewed and discussed with patient certain preventive protocols, quality metrics, and best practice recommendations. A written personalized care plan for preventive services as well as general preventive health recommendations were provided to patient.     [place] Constable, LPN   04/26/567   Nurse Notes: Patient due for DEXA, informed she can get this at next office visit.   I have reviewed and agree with the above AWV documentation Arville Care, MD [facility] 07/30/2020, 1:14 PM

## 2020-12-10 ENCOUNTER — Encounter: Payer: Self-pay | Admitting: Family Medicine

## 2020-12-10 ENCOUNTER — Other Ambulatory Visit: Payer: Self-pay

## 2020-12-10 ENCOUNTER — Ambulatory Visit (INDEPENDENT_AMBULATORY_CARE_PROVIDER_SITE_OTHER): Payer: Medicare Other | Admitting: Family Medicine

## 2020-12-10 VITALS — BP 110/67 | HR 63 | Ht <= 58 in | Wt 128.0 lb

## 2020-12-10 DIAGNOSIS — I10 Essential (primary) hypertension: Secondary | ICD-10-CM

## 2020-12-10 DIAGNOSIS — E7800 Pure hypercholesterolemia, unspecified: Secondary | ICD-10-CM

## 2020-12-10 DIAGNOSIS — D649 Anemia, unspecified: Secondary | ICD-10-CM | POA: Diagnosis not present

## 2020-12-10 DIAGNOSIS — E079 Disorder of thyroid, unspecified: Secondary | ICD-10-CM | POA: Diagnosis not present

## 2020-12-10 NOTE — Progress Notes (Signed)
BP 110/67   Pulse 63   Ht 4' 10"  (1.473 m)   Wt 128 lb (58.1 kg)   SpO2 96%   BMI 26.75 kg/m    Subjective:   Patient ID: Sherry Cooper, female    DOB: 01/26/1941, 80 y.o.   MRN: [id number]  HPI: Sherry Cooper is a 80 y.o. female presenting on 12/10/2020 for Medical Management of Chronic Issues, Hypertension, Hypothyroidism, and Hyperlipidemia   HPI Hypothyroidism recheck Patient is coming in for thyroid recheck today as well. They deny any issues with hair changes or heat or cold problems or diarrhea or constipation. They deny any chest pain or palpitations. They are currently on levothyroxine 50 micrograms   Hypertension Patient is currently on lisinopril, and their blood pressure today is 110/67. Patient denies any lightheadedness or dizziness. Patient denies headaches, blurred vision, chest pains, shortness of breath, or weakness. Denies any side effects from medication and is content with current medication.   Hyperlipidemia Patient is coming in for recheck of his hyperlipidemia. The patient is currently taking fish oil and Crestor. They deny any issues with myalgias or history of liver damage from it. They deny any focal numbness or weakness or chest pain.   Anemia recheck Patient is coming in for anemia recheck.  She denies any chest pains or lightheadedness or dizziness.  Relevant past medical, surgical, family and social history reviewed and updated as indicated. Interim medical history since our last visit reviewed. Allergies and medications reviewed and updated.  Review of Systems  Constitutional:  Negative for chills and fever.  Eyes:  Negative for visual disturbance.  Respiratory:  Negative for chest tightness and shortness of breath.   Cardiovascular:  Negative for chest pain and leg swelling.  Musculoskeletal:  Negative for back pain and gait problem.  Skin:  Negative for rash.  Neurological:  Negative for light-headedness and headaches.  Psychiatric/Behavioral:   Negative for agitation and behavioral problems.   All other systems reviewed and are negative.  Per HPI unless specifically indicated above   Allergies as of 12/10/2020   No Known Allergies      Medication List        Accurate as of December 10, 2020  9:51 AM. If you have any questions, ask your nurse or doctor.          aspirin 81 MG tablet Take 81 mg by mouth daily.   CALTRATE 600 PLUS-VIT D PO Take 1 tablet by mouth daily.   levothyroxine 50 MCG tablet Commonly known as: SYNTHROID Take 1 tablet (50 mcg total) by mouth daily before breakfast.   lisinopril 5 MG tablet Commonly known as: ZESTRIL Take 1 tablet (5 mg total) by mouth daily.   multivitamin tablet Take 1 tablet by mouth daily.   OMEGA 3 PO Take 2 g by mouth 2 (two) times daily.   rosuvastatin 20 MG tablet Commonly known as: CRESTOR Take 1 tablet (20 mg total) by mouth daily.         Objective:   BP 110/67   Pulse 63   Ht 4' 10"  (1.473 m)   Wt 128 lb (58.1 kg)   SpO2 96%   BMI 26.75 kg/m   Wt Readings from Last 3 Encounters:  12/10/20 128 lb (58.1 kg)  07/27/20 139 lb (63 kg)  06/14/20 139 lb (63 kg)    Physical Exam Vitals and nursing note reviewed.  Constitutional:      General: She is not in acute distress.  Appearance: She is well-developed. She is not diaphoretic.  Eyes:     Conjunctiva/sclera: Conjunctivae normal.  Cardiovascular:     Rate and Rhythm: Normal rate and regular rhythm.     Heart sounds: Normal heart sounds. No murmur heard. Pulmonary:     Effort: Pulmonary effort is normal. No respiratory distress.     Breath sounds: Normal breath sounds. No wheezing.  Musculoskeletal:        General: No tenderness. Normal range of motion.  Skin:    General: Skin is warm and dry.     Findings: No rash.  Neurological:     Mental Status: She is alert and oriented to person, place, and time.     Coordination: Coordination normal.  Psychiatric:        Behavior: Behavior  normal.      Assessment & Plan:   Problem List Items Addressed This Visit       Cardiovascular and Mediastinum   Hypertension   Relevant Orders   CBC with Differential/Platelet   CMP14+EGFR   Lipid panel     Endocrine   Thyroid disease - Primary   Relevant Orders   TSH     Other   Hyperlipidemia   Relevant Orders   CMP14+EGFR   Lipid panel   TSH   Anemia   Relevant Orders   CBC with Differential/Platelet    Continue current medicines.  No changes. Follow up plan: Return in about 6 months (around 06/12/2021), or if symptoms worsen or fail to improve, for Physical and hypertension and thyroid.  Counseling provided for all of the vaccine components Orders Placed This Encounter  Procedures   CBC with Differential/Platelet   CMP14+EGFR   Lipid panel   TSH    Caryl Pina, MD [facility] 12/10/2020, 9:51 AM

## 2020-12-11 LAB — LIPID PANEL
Chol/HDL Ratio: 2.3 ratio (ref 0.0–4.4)
Cholesterol, Total: 142 mg/dL (ref 100–199)
HDL: 61 mg/dL (ref >39)
LDL Chol Calc (NIH): 68 mg/dL (ref 0–99)
Triglycerides: 62 mg/dL (ref 0–149)
VLDL Cholesterol Cal: 13 mg/dL (ref 5–40)

## 2020-12-11 LAB — CBC WITH DIFFERENTIAL/PLATELET
Basophils Absolute: 0 10*3/uL (ref 0.0–0.2)
Basos: 1 %
EOS (ABSOLUTE): 0.1 10*3/uL (ref 0.0–0.4)
Eos: 1 %
Hematocrit: 35.5 % (ref 34.0–46.6)
Hemoglobin: 11.7 g/dL (ref 11.1–15.9)
Immature Grans (Abs): 0 10*3/uL (ref 0.0–0.1)
Immature Granulocytes: 0 %
Lymphocytes Absolute: 1.8 10*3/uL (ref 0.7–3.1)
Lymphs: 29 %
MCH: 30.2 pg (ref 26.6–33.0)
MCHC: 33 g/dL (ref 31.5–35.7)
MCV: 92 fL (ref 79–97)
Monocytes Absolute: 0.6 10*3/uL (ref 0.1–0.9)
Monocytes: 10 %
Neutrophils Absolute: 3.7 10*3/uL (ref 1.4–7.0)
Neutrophils: 59 %
Platelets: 240 10*3/uL (ref 150–450)
RBC: 3.88 x10E6/uL (ref 3.77–5.28)
RDW: 12.9 % (ref 11.7–15.4)
WBC: 6.2 10*3/uL (ref 3.4–10.8)

## 2020-12-11 LAB — CMP14+EGFR
ALT: 15 IU/L (ref 0–32)
AST: 24 IU/L (ref 0–40)
Albumin/Globulin Ratio: 1.9 (ref 1.2–2.2)
Albumin: 4.3 g/dL (ref 3.7–4.7)
Alkaline Phosphatase: 91 IU/L (ref 44–121)
BUN/Creatinine Ratio: 19 (ref 12–28)
BUN: 18 mg/dL (ref 8–27)
Bilirubin Total: 0.3 mg/dL (ref 0.0–1.2)
CO2: 21 mmol/L (ref 20–29)
Calcium: 10.1 mg/dL (ref 8.7–10.3)
Chloride: 102 mmol/L (ref 96–106)
Creatinine, Ser: 0.93 mg/dL (ref 0.57–1.00)
Globulin, Total: 2.3 g/dL (ref 1.5–4.5)
Glucose: 83 mg/dL (ref 65–99)
Potassium: 4.6 mmol/L (ref 3.5–5.2)
Sodium: 139 mmol/L (ref 134–144)
Total Protein: 6.6 g/dL (ref 6.0–8.5)
eGFR: 63 mL/min/{1.73_m2} (ref >59)

## 2020-12-11 LAB — TSH: TSH: 1.68 u[IU]/mL (ref 0.450–4.500)

## 2020-12-13 ENCOUNTER — Telehealth: Payer: Self-pay | Admitting: Family Medicine

## 2020-12-13 NOTE — Telephone Encounter (Signed)
Patient aware of results.

## 2020-12-26 ENCOUNTER — Other Ambulatory Visit: Payer: Self-pay | Admitting: Family Medicine

## 2020-12-26 DIAGNOSIS — Z1231 Encounter for screening mammogram for malignant neoplasm of breast: Secondary | ICD-10-CM

## 2021-02-27 DIAGNOSIS — Z961 Presence of intraocular lens: Secondary | ICD-10-CM | POA: Diagnosis not present

## 2021-02-27 DIAGNOSIS — H1045 Other chronic allergic conjunctivitis: Secondary | ICD-10-CM | POA: Diagnosis not present

## 2021-02-27 DIAGNOSIS — H04122 Dry eye syndrome of left lacrimal gland: Secondary | ICD-10-CM | POA: Diagnosis not present

## 2021-02-27 DIAGNOSIS — H40013 Open angle with borderline findings, low risk, bilateral: Secondary | ICD-10-CM | POA: Diagnosis not present

## 2021-03-04 ENCOUNTER — Encounter: Payer: Self-pay | Admitting: Nurse Practitioner

## 2021-03-04 ENCOUNTER — Ambulatory Visit (INDEPENDENT_AMBULATORY_CARE_PROVIDER_SITE_OTHER): Payer: Medicare Other | Admitting: Nurse Practitioner

## 2021-03-04 DIAGNOSIS — Z91199 Patient's noncompliance with other medical treatment and regimen due to unspecified reason: Secondary | ICD-10-CM

## 2021-03-04 NOTE — Progress Notes (Signed)
Patient decided to go to the heath department. She did not cancel this appointment

## 2021-03-06 ENCOUNTER — Other Ambulatory Visit: Payer: Self-pay

## 2021-03-06 ENCOUNTER — Ambulatory Visit
Admission: RE | Admit: 2021-03-06 | Discharge: 2021-03-06 | Disposition: A | Discharge status: Home / Self Care | Payer: Medicare Other | Source: Ambulatory Visit | Attending: Family Medicine | Admitting: Family Medicine

## 2021-03-06 DIAGNOSIS — Z1231 Encounter for screening mammogram for malignant neoplasm of breast: Secondary | ICD-10-CM

## 2021-06-05 ENCOUNTER — Other Ambulatory Visit: Payer: Self-pay | Admitting: Family Medicine

## 2021-06-05 DIAGNOSIS — Z1231 Encounter for screening mammogram for malignant neoplasm of breast: Secondary | ICD-10-CM

## 2021-06-12 ENCOUNTER — Ambulatory Visit (INDEPENDENT_AMBULATORY_CARE_PROVIDER_SITE_OTHER): Payer: Medicare HMO | Admitting: Family Medicine

## 2021-06-12 ENCOUNTER — Encounter: Payer: Self-pay | Admitting: Family Medicine

## 2021-06-12 VITALS — BP 131/66 | HR 98 | Ht <= 58 in | Wt 125.0 lb

## 2021-06-12 DIAGNOSIS — Z0000 Encounter for general adult medical examination without abnormal findings: Secondary | ICD-10-CM

## 2021-06-12 DIAGNOSIS — Z0001 Encounter for general adult medical examination with abnormal findings: Secondary | ICD-10-CM | POA: Diagnosis not present

## 2021-06-12 DIAGNOSIS — E079 Disorder of thyroid, unspecified: Secondary | ICD-10-CM | POA: Diagnosis not present

## 2021-06-12 DIAGNOSIS — E7800 Pure hypercholesterolemia, unspecified: Secondary | ICD-10-CM | POA: Diagnosis not present

## 2021-06-12 DIAGNOSIS — I10 Essential (primary) hypertension: Secondary | ICD-10-CM

## 2021-06-12 MED ORDER — LISINOPRIL 5 MG PO TABS: 5.0000 mg | ORAL_TABLET | Freq: Every day | ORAL | 3 refills | Status: DC

## 2021-06-12 MED ORDER — LEVOTHYROXINE SODIUM 50 MCG PO TABS: 50.0000 ug | ORAL_TABLET | Freq: Every day | ORAL | 3 refills | Status: DC

## 2021-06-12 MED ORDER — ROSUVASTATIN CALCIUM 20 MG PO TABS: 20.0000 mg | ORAL_TABLET | Freq: Every day | ORAL | 3 refills | Status: DC

## 2021-06-12 NOTE — Progress Notes (Signed)
BP 131/66    Pulse 98    Ht 4' 10"  (1.473 m)    Wt 125 lb (56.7 kg)    SpO2 96%    BMI 26.13 kg/m    Subjective:   Patient ID: Sherry Cooper, female    DOB: [birth year], 81 y.o.   MRN: [id number]  HPI: Sherry Cooper is a 81 y.o. female presenting on 06/12/2021 for Medical Management of Chronic Issues (CPE)   HPI Adult well exam and physical Patient is coming in for adult well exam and physical and recheck of chronic medical issues.  She says she has a little bit of arthritis aches and pains in her knees, worse left than right other than that she does pretty well. Patient denies any chest pain, shortness of breath, headaches or vision issues, abdominal complaints, diarrhea, nausea, vomiting, or other joint issues.   Hypothyroidism recheck Patient is coming in for thyroid recheck today as well. They deny any issues with hair changes or heat or cold problems or diarrhea or constipation. They deny any chest pain or palpitations. They are currently on levothyroxine 50 micrograms   Hypertension Patient is currently on lisinopril, and their blood pressure today is 131/66. Patient denies any lightheadedness or dizziness. Patient denies headaches, blurred vision, chest pains, shortness of breath, or weakness. Denies any side effects from medication and is content with current medication.   Hyperlipidemia Patient is coming in for recheck of his hyperlipidemia. The patient is currently taking fish oil and Crestor. They deny any issues with myalgias or history of liver damage from it. They deny any focal numbness or weakness or chest pain.   Relevant past medical, surgical, family and social history reviewed and updated as indicated. Interim medical history since our last visit reviewed. Allergies and medications reviewed and updated.  Review of Systems  Constitutional:  Negative for chills and fever.  HENT:  Negative for congestion, ear discharge, ear pain and tinnitus.   Eyes:  Negative for pain,  redness and visual disturbance.  Respiratory:  Negative for cough, chest tightness, shortness of breath and wheezing.   Cardiovascular:  Negative for chest pain, palpitations and leg swelling.  Gastrointestinal:  Negative for abdominal pain, blood in stool, constipation and diarrhea.  Genitourinary:  Negative for difficulty urinating, dysuria and hematuria.  Musculoskeletal:  Negative for back pain, gait problem and myalgias.  Skin:  Negative for rash.  Neurological:  Negative for dizziness, weakness, light-headedness and headaches.  Psychiatric/Behavioral:  Negative for agitation, behavioral problems and suicidal ideas.   All other systems reviewed and are negative.  Per HPI unless specifically indicated above   Allergies as of 06/12/2021   No Known Allergies      Medication List        Accurate as of June 12, 2021 10:42 AM. If you have any questions, ask your nurse or doctor.          aspirin 81 MG tablet Take 81 mg by mouth daily.   CALTRATE 600 PLUS-VIT D PO Take 1 tablet by mouth daily.   levothyroxine 50 MCG tablet Commonly known as: SYNTHROID Take 1 tablet (50 mcg total) by mouth daily before breakfast.   lisinopril 5 MG tablet Commonly known as: ZESTRIL Take 1 tablet (5 mg total) by mouth daily.   multivitamin tablet Take 1 tablet by mouth daily.   OMEGA 3 PO Take 2 g by mouth 2 (two) times daily.   rosuvastatin 20 MG tablet Commonly known as: CRESTOR Take 1 tablet (  20 mg total) by mouth daily.         Objective:   BP 131/66    Pulse 98    Ht 4' 10"  (1.473 m)    Wt 125 lb (56.7 kg)    SpO2 96%    BMI 26.13 kg/m   Wt Readings from Last 3 Encounters:  06/12/21 125 lb (56.7 kg)  12/10/20 128 lb (58.1 kg)  07/27/20 139 lb (63 kg)    Physical Exam Vitals and nursing note reviewed.  Constitutional:      General: She is not in acute distress.    Appearance: She is well-developed. She is not diaphoretic.  HENT:     Right Ear: Tympanic  membrane normal.     Left Ear: Tympanic membrane normal.     Mouth/Throat:     Mouth: Mucous membranes are moist.     Pharynx: Oropharynx is clear.  Eyes:     Conjunctiva/sclera: Conjunctivae normal.  Cardiovascular:     Rate and Rhythm: Normal rate and regular rhythm.     Heart sounds: Normal heart sounds. No murmur heard. Pulmonary:     Effort: Pulmonary effort is normal. No respiratory distress.     Breath sounds: Normal breath sounds. No wheezing.  Abdominal:     General: Abdomen is flat. There is no distension.     Tenderness: There is no abdominal tenderness. There is no right CVA tenderness, left CVA tenderness, guarding or rebound.  Musculoskeletal:        General: No swelling or tenderness. Normal range of motion.     Right knee: Crepitus present. No swelling, deformity, effusion, erythema or bony tenderness. Normal range of motion. No tenderness. No LCL laxity, MCL laxity, ACL laxity or PCL laxity. Normal alignment, normal meniscus and normal patellar mobility.     Left knee: Crepitus present. No swelling, deformity, effusion, erythema or bony tenderness. Normal range of motion. No tenderness. No LCL laxity, MCL laxity, ACL laxity or PCL laxity.Normal alignment, normal meniscus and normal patellar mobility.  Skin:    General: Skin is warm and dry.     Findings: No rash.  Neurological:     Mental Status: She is alert and oriented to person, place, and time.     Coordination: Coordination normal.  Psychiatric:        Behavior: Behavior normal.      Assessment & Plan:   Problem List Items Addressed This Visit       Cardiovascular and Mediastinum   Hypertension   Relevant Medications   lisinopril (ZESTRIL) 5 MG tablet   rosuvastatin (CRESTOR) 20 MG tablet   Other Relevant Orders   CBC with Differential/Platelet   CMP14+EGFR   Lipid panel   TSH     Endocrine   Thyroid disease   Relevant Medications   levothyroxine (SYNTHROID) 50 MCG tablet   Other Relevant  Orders   CBC with Differential/Platelet   CMP14+EGFR   Lipid panel   TSH     Other   Hyperlipidemia   Relevant Medications   lisinopril (ZESTRIL) 5 MG tablet   rosuvastatin (CRESTOR) 20 MG tablet   Other Relevant Orders   CBC with Differential/Platelet   CMP14+EGFR   Lipid panel   TSH   Other Visit Diagnoses     Physical exam    -  Primary       Patient's arthritis looks to be present.  Recommended that she see an orthopedic if it is bothering her enough and she is  going to think on it.  Recheck labs, no change in medicine. Follow up plan: Return in about 6 months (around 12/10/2021), or if symptoms worsen or fail to improve, for Hypertension and cholesterol and thyroid.  Counseling provided for all of the vaccine components Orders Placed This Encounter  Procedures   CBC with Differential/Platelet   CMP14+EGFR   Lipid panel   TSH    Caryl Pina, MD [facility] 06/12/2021, 10:42 AM

## 2021-06-13 LAB — CBC WITH DIFFERENTIAL/PLATELET
Basophils Absolute: 0 10*3/uL (ref 0.0–0.2)
Basos: 1 %
EOS (ABSOLUTE): 0 10*3/uL (ref 0.0–0.4)
Eos: 0 %
Hematocrit: 36.3 % (ref 34.0–46.6)
Hemoglobin: 12 g/dL (ref 11.1–15.9)
Immature Grans (Abs): 0 10*3/uL (ref 0.0–0.1)
Immature Granulocytes: 0 %
Lymphocytes Absolute: 1.9 10*3/uL (ref 0.7–3.1)
Lymphs: 26 %
MCH: 30.2 pg (ref 26.6–33.0)
MCHC: 33.1 g/dL (ref 31.5–35.7)
MCV: 91 fL (ref 79–97)
Monocytes Absolute: 0.7 10*3/uL (ref 0.1–0.9)
Monocytes: 10 %
Neutrophils Absolute: 4.6 10*3/uL (ref 1.4–7.0)
Neutrophils: 63 %
Platelets: 252 10*3/uL (ref 150–450)
RBC: 3.98 x10E6/uL (ref 3.77–5.28)
RDW: 12.7 % (ref 11.7–15.4)
WBC: 7.3 10*3/uL (ref 3.4–10.8)

## 2021-06-13 LAB — TSH: TSH: 1.65 u[IU]/mL (ref 0.450–4.500)

## 2021-06-13 LAB — CMP14+EGFR
ALT: 22 IU/L (ref 0–32)
AST: 33 IU/L (ref 0–40)
Albumin/Globulin Ratio: 1.7 (ref 1.2–2.2)
Albumin: 4.4 g/dL (ref 3.7–4.7)
Alkaline Phosphatase: 98 IU/L (ref 44–121)
BUN/Creatinine Ratio: 12 (ref 12–28)
BUN: 13 mg/dL (ref 8–27)
Bilirubin Total: 0.3 mg/dL (ref 0.0–1.2)
CO2: 23 mmol/L (ref 20–29)
Calcium: 10.6 mg/dL — ABNORMAL HIGH (ref 8.7–10.3)
Chloride: 100 mmol/L (ref 96–106)
Creatinine, Ser: 1.08 mg/dL — ABNORMAL HIGH (ref 0.57–1.00)
Globulin, Total: 2.6 g/dL (ref 1.5–4.5)
Glucose: 87 mg/dL (ref 70–99)
Potassium: 4.8 mmol/L (ref 3.5–5.2)
Sodium: 139 mmol/L (ref 134–144)
Total Protein: 7 g/dL (ref 6.0–8.5)
eGFR: 52 mL/min/{1.73_m2} — ABNORMAL LOW (ref >59)

## 2021-06-13 LAB — LIPID PANEL
Chol/HDL Ratio: 2.4 ratio (ref 0.0–4.4)
Cholesterol, Total: 139 mg/dL (ref 100–199)
HDL: 58 mg/dL (ref >39)
LDL Chol Calc (NIH): 67 mg/dL (ref 0–99)
Triglycerides: 68 mg/dL (ref 0–149)
VLDL Cholesterol Cal: 14 mg/dL (ref 5–40)

## 2021-06-14 ENCOUNTER — Telehealth: Payer: Self-pay | Admitting: Family Medicine

## 2021-06-14 NOTE — Telephone Encounter (Signed)
Patient asking for results please advise.

## 2021-06-17 ENCOUNTER — Encounter: Payer: Self-pay | Admitting: *Deleted

## 2021-06-24 ENCOUNTER — Encounter: Payer: Self-pay | Admitting: Gastroenterology

## 2021-07-30 ENCOUNTER — Ambulatory Visit (INDEPENDENT_AMBULATORY_CARE_PROVIDER_SITE_OTHER): Payer: Medicare HMO

## 2021-07-30 VITALS — Wt 125.0 lb

## 2021-07-30 DIAGNOSIS — Z0000 Encounter for general adult medical examination without abnormal findings: Secondary | ICD-10-CM | POA: Diagnosis not present

## 2021-07-30 NOTE — Patient Instructions (Signed)
[name] , ?Thank you for taking time to come for your Medicare Wellness Visit. I appreciate your ongoing commitment to your health goals. Please review the following plan we discussed and let me know if I can assist you in the future.  ? ?Screening recommendations/referrals: ?Colonoscopy: Done 02/25/2011 - No repeat required ?Mammogram: Done 03/06/2021 - Repeat annually ?Bone Density: Done 06/02/2017 - Repeat every 2 years *due ?Recommended yearly ophthalmology/optometry visit for glaucoma screening and checkup ?Recommended yearly dental visit for hygiene and checkup ? ?Vaccinations: ?Influenza vaccine: Done 03/21/2021 - Repeat annually  ?Pneumococcal vaccine: Done 11/20/2010, 02/11/2012, & 12/22/2013  ?Tdap vaccine: Done 02/04/2011 - Repeat in 10 years *due ?Shingles vaccine: Done 09/09/2017 & 11/25/2017 ?Covid-19: Done 05/17/2019 & 06/14/2019 - for boosters, contact pharmacy ? ?Advanced directives: Advance directive discussed with you today. Even though you declined this today, please call our office should you change your mind, and we can give you the proper paperwork for you to fill out.  ? ?Conditions/risks identified: Aim for 30 minutes of exercise or brisk walking, 6-8 glasses of water, and 5 servings of fruits and vegetables each day.  ? ?Next appointment: Follow up in one year for your annual wellness visit  ? ? ?Preventive Care 40 Years and Older, Female ?Preventive care refers to lifestyle choices and visits with your health care provider that can promote health and wellness. ?What does preventive care include? ?A yearly physical exam. This is also called an annual well check. ?Dental exams once or twice a year. ?Routine eye exams. Ask your health care provider how often you should have your eyes checked. ?Personal lifestyle choices, including: ?Daily care of your teeth and gums. ?Regular physical activity. ?Eating a healthy diet. ?Avoiding tobacco and drug use. ?Limiting alcohol use. ?Practicing safe sex. ?Taking  low-dose aspirin every day. ?Taking vitamin and mineral supplements as recommended by your health care provider. ?What happens during an annual well check? ?The services and screenings done by your health care provider during your annual well check will depend on your age, overall health, lifestyle risk factors, and family history of disease. ?Counseling  ?Your health care provider may ask you questions about your: ?Alcohol use. ?Tobacco use. ?Drug use. ?Emotional well-being. ?Home and relationship well-being. ?Sexual activity. ?Eating habits. ?History of falls. ?Memory and ability to understand (cognition). ?Work and work Statistician. ?Reproductive health. ?Screening  ?You may have the following tests or measurements: ?Height, weight, and BMI. ?Blood pressure. ?Lipid and cholesterol levels. These may be checked every 5 years, or more frequently if you are over 68 years old. ?Skin check. ?Lung cancer screening. You may have this screening every year starting at age 7 if you have a 30-pack-year history of smoking and currently smoke or have quit within the past 15 years. ?Fecal occult blood test (FOBT) of the stool. You may have this test every year starting at age 50. ?Flexible sigmoidoscopy or colonoscopy. You may have a sigmoidoscopy every 5 years or a colonoscopy every 10 years starting at age 79. ?Hepatitis C blood test. ?Hepatitis B blood test. ?Sexually transmitted disease (STD) testing. ?Diabetes screening. This is done by checking your blood sugar (glucose) after you have not eaten for a while (fasting). You may have this done every 1-3 years. ?Bone density scan. This is done to screen for osteoporosis. You may have this done starting at age 62. ?Mammogram. This may be done every 1-2 years. Talk to your health care provider about how often you should have regular mammograms. ?Talk  with your health care provider about your test results, treatment options, and if necessary, the need for more tests. ?Vaccines   ?Your health care provider may recommend certain vaccines, such as: ?Influenza vaccine. This is recommended every year. ?Tetanus, diphtheria, and acellular pertussis (Tdap, Td) vaccine. You may need a Td booster every 10 years. ?Zoster vaccine. You may need this after age 51. ?Pneumococcal 13-valent conjugate (PCV13) vaccine. One dose is recommended after age 58. ?Pneumococcal polysaccharide (PPSV23) vaccine. One dose is recommended after age 61. ?Talk to your health care provider about which screenings and vaccines you need and how often you need them. ?This information is not intended to replace advice given to you by your health care provider. Make sure you discuss any questions you have with your health care provider. ?Document Released: 05/04/2015 Document Revised: 12/26/2015 Document Reviewed: 02/06/2015 ?Elsevier Interactive Patient Education ? 2017 [place]. ? ?Fall Prevention in the Home ?Falls can cause injuries. They can happen to people of all ages. There are many things you can do to make your home safe and to help prevent falls. ?What can I do on the outside of my home? ?Regularly fix the edges of walkways and driveways and fix any cracks. ?Remove anything that might make you trip as you walk through a door, such as a raised step or threshold. ?Trim any bushes or trees on the path to your home. ?Use bright outdoor lighting. ?Clear any walking paths of anything that might make someone trip, such as rocks or tools. ?Regularly check to see if handrails are loose or broken. Make sure that both sides of any steps have handrails. ?Any raised decks and porches should have guardrails on the edges. ?Have any leaves, snow, or ice cleared regularly. ?Use sand or salt on walking paths during winter. ?Clean up any spills in your garage right away. This includes oil or grease spills. ?What can I do in the bathroom? ?Use night lights. ?Install grab bars by the toilet and in the tub and shower. Do not use towel  bars as grab bars. ?Use non-skid mats or decals in the tub or shower. ?If you need to sit down in the shower, use a plastic, non-slip stool. ?Keep the floor dry. Clean up any water that spills on the floor as soon as it happens. ?Remove soap buildup in the tub or shower regularly. ?Attach bath mats securely with double-sided non-slip rug tape. ?Do not have throw rugs and other things on the floor that can make you trip. ?What can I do in the bedroom? ?Use night lights. ?Make sure that you have a light by your bed that is easy to reach. ?Do not use any sheets or blankets that are too big for your bed. They should not hang down onto the floor. ?Have a firm chair that has side arms. You can use this for support while you get dressed. ?Do not have throw rugs and other things on the floor that can make you trip. ?What can I do in the kitchen? ?Clean up any spills right away. ?Avoid walking on wet floors. ?Keep items that you use a lot in easy-to-reach places. ?If you need to reach something above you, use a strong step stool that has a grab bar. ?Keep electrical cords out of the way. ?Do not use floor polish or wax that makes floors slippery. If you must use wax, use non-skid floor wax. ?Do not have throw rugs and other things on the floor that can make you  trip. ?What can I do with my stairs? ?Do not leave any items on the stairs. ?Make sure that there are handrails on both sides of the stairs and use them. Fix handrails that are broken or loose. Make sure that handrails are as long as the stairways. ?Check any carpeting to make sure that it is firmly attached to the stairs. Fix any carpet that is loose or worn. ?Avoid having throw rugs at the top or bottom of the stairs. If you do have throw rugs, attach them to the floor with carpet tape. ?Make sure that you have a light switch at the top of the stairs and the bottom of the stairs. If you do not have them, ask someone to add them for you. ?What else can I do to help  prevent falls? ?Wear shoes that: ?Do not have high heels. ?Have rubber bottoms. ?Are comfortable and fit you well. ?Are closed at the toe. Do not wear sandals. ?If you use a stepladder: ?Make sure that

## 2021-07-30 NOTE — Progress Notes (Signed)
? ?Subjective:  ? Sherry Cooper is a 81 y.o. female who presents for Medicare Annual (Subsequent) preventive examination. ? ?Virtual Visit via Telephone Note ? ?I connected with  [name] on 07/30/21 at 12:00 PM EDT by telephone and verified that I am speaking with the correct person using two identifiers. ? ?Location: ?Patient: Home ?Provider: [facility] ?Persons participating in the virtual visit: patient/Nurse Health Advisor ?  ?I discussed the limitations, risks, security and privacy concerns of performing an evaluation and management service by telephone and the availability of in person appointments. The patient expressed understanding and agreed to proceed. ? ?Interactive audio and video telecommunications were attempted between this nurse and patient, however failed, due to patient having technical difficulties OR patient did not have access to video capability.  We continued and completed visit with audio only. ? ?Some vital signs may be absent or patient reported.  ? ?[name], LPN  ? ?Review of Systems    ? ?Cardiac Risk Factors include: advanced age (>80men, >2 women);dyslipidemia;hypertension;Other (see comment), Risk factor comments: anemia ? ?   ?Objective:  ?  ?Today's Vitals  ? 07/30/21 1202  ?Weight: 125 lb (56.7 kg)  ? ?Body mass index is 26.13 kg/m?. ? ? ?  07/30/2021  ? 12:15 PM 07/27/2020  ?  4:20 PM 06/23/2019  ? 10:01 AM 06/21/2018  ?  9:13 AM 06/02/2017  ? 10:55 AM  ?Advanced Directives  ?Does Patient Have a Medical Advance Directive? No No No No No  ?Would patient like information on creating a medical advance directive? No - Patient declined Yes (MAU/Ambulatory/Procedural Areas - Information given) No - Patient declined Yes (MAU/Ambulatory/Procedural Areas - Information given) Yes (MAU/Ambulatory/Procedural Areas - Information given)  ? ? ?Current Medications (verified) ?Outpatient Encounter Medications as of 07/30/2021  ?Medication Sig  ? aspirin 81 MG tablet Take 81 mg by mouth daily.  ?  Calcium-Vitamin D (CALTRATE 600 PLUS-VIT D PO) Take 1 tablet by mouth daily.  ? levothyroxine (SYNTHROID) 50 MCG tablet Take 1 tablet (50 mcg total) by mouth daily before breakfast.  ? lisinopril (ZESTRIL) 5 MG tablet Take 1 tablet (5 mg total) by mouth daily.  ? Multiple Vitamin (MULTIVITAMIN) tablet Take 1 tablet by mouth daily.  ? Omega-3 Fatty Acids (OMEGA 3 PO) Take 2 g by mouth 2 (two) times daily.  ? rosuvastatin (CRESTOR) 20 MG tablet Take 1 tablet (20 mg total) by mouth daily.  ? ?No facility-administered encounter medications on file as of 07/30/2021.  ? ? ?Allergies (verified) ?Patient has no known allergies.  ? ?History: ?Past Medical History:  ?Diagnosis Date  ? Arthritis   ? Cataract   ? Bilateral  Surgery on both cant remember dates   ? Hyperlipidemia   ? Hypertension   ? Thyroid disease   ? ?Past Surgical History:  ?Procedure Laterality Date  ? ABDOMINAL HYSTERECTOMY  1987  ? CHOLECYSTECTOMY  05/31/2002  ? laparoscopic  ? DILATION AND CURETTAGE OF UTERUS  1987  ? EYE SURGERY Bilateral   ? cataracts removed   ? ?Family History  ?Problem Relation Age of Onset  ? Congestive Heart Failure Father   ? Cancer Sister   ?     uterine and breast and lung  ? Diabetes Sister   ? Migraines Daughter   ? Diabetes Brother   ? COPD Brother   ? Colon cancer Neg Hx   ? Stomach cancer Neg Hx   ? Breast cancer Neg Hx   ? ?Social History  ? ?  Socioeconomic History  ? Marital status: Widowed  ?  Spouse name: Not on file  ? Number of children: 2  ? Years of education: 85  ? Highest education level: 12th grade  ?Occupational History  ? Occupation: Retired   ?  Comment: [employer]   ?Tobacco Use  ? Smoking status: Never  ? Smokeless tobacco: Never  ?Vaping Use  ? Vaping Use: Never used  ?Substance and Sexual Activity  ? Alcohol use: No  ? Drug use: No  ? Sexual activity: Not Currently  ?Other Topics Concern  ? Not on file  ?Social History Narrative  ? Lives alone  ? ?Social Determinants of Health  ? ?Financial Resource  Strain: Low Risk   ? Difficulty of Paying Living Expenses: Not hard at all  ?Food Insecurity: No Food Insecurity  ? Worried About Programme researcher, broadcasting/film/video in the Last Year: Never true  ? Ran Out of Food in the Last Year: Never true  ?Transportation Needs: No Transportation Needs  ? Lack of Transportation (Medical): No  ? Lack of Transportation (Non-Medical): No  ?Physical Activity: Sufficiently Active  ? Days of Exercise per Week: 7 days  ? Minutes of Exercise per Session: 30 min  ?Stress: No Stress Concern Present  ? Feeling of Stress : Not at all  ?Social Connections: Moderately Integrated  ? Frequency of Communication with Friends and Family: More than three times a week  ? Frequency of Social Gatherings with Friends and Family: More than three times a week  ? Attends Religious Services: More than 4 times per year  ? Active Member of Clubs or Organizations: Yes  ? Attends Banker Meetings: More than 4 times per year  ? Marital Status: Widowed  ? ? ?Tobacco Counseling ?Counseling given: Not Answered ? ? ?Clinical Intake: ? ?Pre-visit preparation completed: Yes ? ?Pain : No/denies pain ? ?  ? ?BMI - recorded: 26.13 ?Nutritional Status: BMI 25 -29 Overweight ?Nutritional Risks: None ?Diabetes: No ? ?How often do you need to have someone help you when you read instructions, pamphlets, or other written materials from your doctor or pharmacy?: 1 - Never ? ?Diabetic? no ? ?Interpreter Needed?: No ? ?Information entered by :: Rylee Nuzum, LPN ? ? ?Activities of Daily Living ? ?  07/30/2021  ? 12:07 PM  ?In your present state of health, do you have any difficulty performing the following activities:  ?Hearing? 0  ?Vision? 0  ?Difficulty concentrating or making decisions? 0  ?Walking or climbing stairs? 0  ?Dressing or bathing? 0  ?Doing errands, shopping? 0  ?Preparing Food and eating ? N  ?Using the Toilet? N  ?In the past six months, have you accidently leaked urine? N  ?Do you have problems with loss of bowel  control? N  ?Managing your Medications? N  ?Managing your Finances? N  ?Housekeeping or managing your Housekeeping? N  ? ? ?Patient Care Team: ?Dettinger, Elige Radon, MD as PCP - General (Family Medicine) ?Ernesto Rutherford, MD as Consulting Physician (Ophthalmology) ? ?Indicate any recent Medical Services you may have received from other than [facility] providers in the past year (date may be approximate). ? ?   ?Assessment:  ? This is a routine wellness examination for Mychaela. ? ?Hearing/Vision screen ?Hearing Screening - Comments:: Denies hearing difficulties   ?Vision Screening - Comments:: No vision concerns - up to date with routine eye exams with Ernesto Rutherford ? ?Dietary issues and exercise activities discussed: ?Current Exercise Habits: Home exercise  routine, Type of exercise: walking, Time (Minutes): 30, Frequency (Times/Week): 7, Weekly Exercise (Minutes/Week): 210, Intensity: Mild, Exercise limited by: None identified ? ? Goals Addressed   ? ?  ?  ?  ?  ? This Visit's Progress  ?  Have 3 meals a day   On track  ?  Try to eat 3 meals a day including fruits and vegetables. Low fat.cholesterol diet handouts given  ?  ?  Prevent falls   On track  ?  Stay active  ?  ? ?  ? ?Depression Screen ? ?  07/30/2021  ? 12:04 PM 06/12/2021  ? 10:04 AM 12/10/2020  ?  9:20 AM 07/27/2020  ?  4:24 PM 06/14/2020  ?  9:07 AM 12/14/2019  ?  9:27 AM 06/23/2019  ? 10:03 AM  ?PHQ 2/9 Scores  ?PHQ - 2 Score 0 0 0 0 0 0 0  ?PHQ- 9 Score 0 0       ?  ?Fall Risk ? ?  07/30/2021  ? 12:03 PM 06/12/2021  ? 10:04 AM 12/10/2020  ?  9:20 AM 07/27/2020  ?  4:26 PM 06/14/2020  ?  9:06 AM  ?Fall Risk   ?Falls in the past year? 0 0 0 0 0  ?Number falls in past yr: 0   0   ?Injury with Fall? 0   0   ?Risk for fall due to : No Fall Risks   No Fall Risks   ?Follow up Falls prevention discussed   Falls prevention discussed   ? ? ?FALL RISK PREVENTION PERTAINING TO THE HOME: ? ?Any stairs in or around the home? Yes  ?If so, are there any without handrails? No  ?Home free of  loose throw rugs in walkways, pet beds, electrical cords, etc? Yes  ?Adequate lighting in your home to reduce risk of falls? Yes  ? ?ASSISTIVE DEVICES UTILIZED TO PREVENT FALLS: ? ?Life alert? No  ?Use of a c

## 2021-12-11 ENCOUNTER — Encounter: Payer: Self-pay | Admitting: Family Medicine

## 2021-12-11 ENCOUNTER — Ambulatory Visit (INDEPENDENT_AMBULATORY_CARE_PROVIDER_SITE_OTHER): Payer: Medicare HMO | Admitting: Family Medicine

## 2021-12-11 VITALS — BP 129/59 | HR 81 | Temp 97.4°F | Ht <= 58 in | Wt 119.0 lb

## 2021-12-11 DIAGNOSIS — D649 Anemia, unspecified: Secondary | ICD-10-CM

## 2021-12-11 DIAGNOSIS — I10 Essential (primary) hypertension: Secondary | ICD-10-CM

## 2021-12-11 DIAGNOSIS — E079 Disorder of thyroid, unspecified: Secondary | ICD-10-CM | POA: Diagnosis not present

## 2021-12-11 DIAGNOSIS — E7800 Pure hypercholesterolemia, unspecified: Secondary | ICD-10-CM | POA: Diagnosis not present

## 2021-12-11 NOTE — Progress Notes (Signed)
BP (!) 129/59   Pulse 81   Temp (!) 97.4 F (36.3 C)   Ht _0  (1.473 m)   Wt 119 lb (54 kg)   SpO2 99%   BMI 24.87 kg/m    Subjective:   Patient ID: Sherry Cooper, female    DOB: 1940/09/17, 81 y.o.   MRN: [id number]  HPI: Sherry Cooper is a 81 y.o. female presenting on 12/11/2021 for Medical Management of Chronic Issues, Hypertension, and Hypothyroidism   HPI Hypertension Patient is currently on lisinopril, and their blood pressure today is 129/59. Patient denies any lightheadedness or dizziness. Patient denies headaches, blurred vision, chest pains, shortness of breath, or weakness. Denies any side effects from medication and is content with current medication.   Hypothyroidism recheck Patient is coming in for thyroid recheck today as well. They deny any issues with hair changes or heat or cold problems or diarrhea or constipation. They deny any chest pain or palpitations. They are currently on levothyroxine 50 micrograms   Hyperlipidemia Patient is coming in for recheck of his hyperlipidemia. The patient is currently taking Crestor. They deny any issues with myalgias or history of liver damage from it. They deny any focal numbness or weakness or chest pain.  Anemia recheck Patient has had mild anemia in the past, we will recheck levels today.  Denies any symptoms of lightheadedness or dizziness or chest pain.  Relevant past medical, surgical, family and social history reviewed and updated as indicated. Interim medical history since our last visit reviewed. Allergies and medications reviewed and updated.  Review of Systems  Constitutional:  Negative for chills and fever.  HENT:  Negative for congestion, ear discharge and ear pain.   Eyes:  Negative for redness and visual disturbance.  Respiratory:  Negative for chest tightness and shortness of breath.   Cardiovascular:  Negative for chest pain and leg swelling.  Genitourinary:  Negative for difficulty urinating and dysuria.   Skin:  Negative for rash.  Neurological:  Negative for light-headedness and headaches.  Psychiatric/Behavioral:  Negative for agitation, behavioral problems and dysphoric mood. The patient is not nervous/anxious.   All other systems reviewed and are negative.   Per HPI unless specifically indicated above   Allergies as of 12/11/2021   No Known Allergies      Medication List        Accurate as of December 11, 2021  9:36 AM. If you have any questions, ask your nurse or doctor.          aspirin 81 MG tablet Take 81 mg by mouth daily.   CALTRATE 600 PLUS-VIT D PO Take 1 tablet by mouth daily.   levothyroxine 50 MCG tablet Commonly known as: SYNTHROID Take 1 tablet (50 mcg total) by mouth daily before breakfast.   lisinopril 5 MG tablet Commonly known as: ZESTRIL Take 1 tablet (5 mg total) by mouth daily.   multivitamin tablet Take 1 tablet by mouth daily.   OMEGA 3 PO Take 2 g by mouth 2 (two) times daily.   rosuvastatin 20 MG tablet Commonly known as: CRESTOR Take 1 tablet (20 mg total) by mouth daily.         Objective:   BP (!) 129/59   Pulse 81   Temp (!) 97.4 F (36.3 C)   Ht _1  (1.473 m)   Wt 119 lb (54 kg)   SpO2 99%   BMI 24.87 kg/m   Wt Readings from Last 3 Encounters:  12/11/21 119 lb (  54 kg)  07/30/21 125 lb (56.7 kg)  06/12/21 125 lb (56.7 kg)    Physical Exam Vitals and nursing note reviewed.  Constitutional:      General: She is not in acute distress.    Appearance: She is well-developed. She is not diaphoretic.  Eyes:     Conjunctiva/sclera: Conjunctivae normal.  Cardiovascular:     Rate and Rhythm: Normal rate and regular rhythm.     Heart sounds: Normal heart sounds. No murmur heard. Pulmonary:     Effort: Pulmonary effort is normal. No respiratory distress.     Breath sounds: Normal breath sounds. No wheezing.  Musculoskeletal:        General: No swelling or tenderness. Normal range of motion.  Skin:    General:  Skin is warm and dry.     Findings: No rash.  Neurological:     Mental Status: She is alert and oriented to person, place, and time.     Coordination: Coordination normal.  Psychiatric:        Behavior: Behavior normal.       Assessment & Plan:   Problem List Items Addressed This Visit       Cardiovascular and Mediastinum   Hypertension   Relevant Orders   CBC with Differential/Platelet   CMP14+EGFR     Endocrine   Thyroid disease - Primary   Relevant Orders   TSH     Other   Hyperlipidemia   Relevant Orders   Lipid panel   Anemia   Relevant Orders   CBC with Differential/Platelet    Continue with current medicine, seems to be doing well, will check blood work Follow up plan: Return in about 6 months (around 06/13/2022), or if symptoms worsen or fail to improve, for Hypertension and hypothyroidism and hyperlipidemia and anemia.  Counseling provided for all of the vaccine components Orders Placed This Encounter  Procedures   CBC with Differential/Platelet   CMP14+EGFR   Lipid panel   TSH    Caryl Pina, MD [facility] 12/11/2021, 9:36 AM

## 2021-12-12 LAB — CBC WITH DIFFERENTIAL/PLATELET
Basophils Absolute: 0 10*3/uL (ref 0.0–0.2)
Basos: 1 %
EOS (ABSOLUTE): 0 10*3/uL (ref 0.0–0.4)
Eos: 1 %
Hematocrit: 31.6 % — ABNORMAL LOW (ref 34.0–46.6)
Hemoglobin: 11 g/dL — ABNORMAL LOW (ref 11.1–15.9)
Immature Grans (Abs): 0 10*3/uL (ref 0.0–0.1)
Immature Granulocytes: 0 %
Lymphocytes Absolute: 1.7 10*3/uL (ref 0.7–3.1)
Lymphs: 34 %
MCH: 30.6 pg (ref 26.6–33.0)
MCHC: 34.8 g/dL (ref 31.5–35.7)
MCV: 88 fL (ref 79–97)
Monocytes Absolute: 0.6 10*3/uL (ref 0.1–0.9)
Monocytes: 12 %
Neutrophils Absolute: 2.7 10*3/uL (ref 1.4–7.0)
Neutrophils: 52 %
Platelets: 222 10*3/uL (ref 150–450)
RBC: 3.59 x10E6/uL — ABNORMAL LOW (ref 3.77–5.28)
RDW: 12.9 % (ref 11.7–15.4)
WBC: 5.1 10*3/uL (ref 3.4–10.8)

## 2021-12-12 LAB — LIPID PANEL
Chol/HDL Ratio: 2.1 ratio (ref 0.0–4.4)
Cholesterol, Total: 140 mg/dL (ref 100–199)
HDL: 67 mg/dL (ref >39)
LDL Chol Calc (NIH): 62 mg/dL (ref 0–99)
Triglycerides: 47 mg/dL (ref 0–149)
VLDL Cholesterol Cal: 11 mg/dL (ref 5–40)

## 2021-12-12 LAB — CMP14+EGFR
ALT: 18 IU/L (ref 0–32)
AST: 27 IU/L (ref 0–40)
Albumin/Globulin Ratio: 1.9 (ref 1.2–2.2)
Albumin: 4.4 g/dL (ref 3.8–4.8)
Alkaline Phosphatase: 81 IU/L (ref 44–121)
BUN/Creatinine Ratio: 18 (ref 12–28)
BUN: 20 mg/dL (ref 8–27)
Bilirubin Total: 0.4 mg/dL (ref 0.0–1.2)
CO2: 21 mmol/L (ref 20–29)
Calcium: 10.3 mg/dL (ref 8.7–10.3)
Chloride: 98 mmol/L (ref 96–106)
Creatinine, Ser: 1.09 mg/dL — ABNORMAL HIGH (ref 0.57–1.00)
Globulin, Total: 2.3 g/dL (ref 1.5–4.5)
Glucose: 81 mg/dL (ref 70–99)
Potassium: 4.6 mmol/L (ref 3.5–5.2)
Sodium: 136 mmol/L (ref 134–144)
Total Protein: 6.7 g/dL (ref 6.0–8.5)
eGFR: 51 mL/min/{1.73_m2} — ABNORMAL LOW (ref >59)

## 2021-12-12 LAB — TSH: TSH: 1.28 u[IU]/mL (ref 0.450–4.500)

## 2021-12-17 ENCOUNTER — Telehealth: Payer: Self-pay | Admitting: Family Medicine

## 2021-12-17 NOTE — Telephone Encounter (Signed)
PCP to address tomorrow

## 2021-12-17 NOTE — Telephone Encounter (Unsigned)
Please call patient with lab results

## 2021-12-17 NOTE — Telephone Encounter (Signed)
Patient is calling wanting lab results.  Please advise or can it wait till PCP comes back tomorrow?

## 2022-03-05 DIAGNOSIS — H04122 Dry eye syndrome of left lacrimal gland: Secondary | ICD-10-CM | POA: Diagnosis not present

## 2022-03-05 DIAGNOSIS — H40013 Open angle with borderline findings, low risk, bilateral: Secondary | ICD-10-CM | POA: Diagnosis not present

## 2022-03-05 DIAGNOSIS — H43813 Vitreous degeneration, bilateral: Secondary | ICD-10-CM | POA: Diagnosis not present

## 2022-03-05 DIAGNOSIS — H1045 Other chronic allergic conjunctivitis: Secondary | ICD-10-CM | POA: Diagnosis not present

## 2022-04-02 ENCOUNTER — Ambulatory Visit
Admission: RE | Admit: 2022-04-02 | Discharge: 2022-04-02 | Disposition: A | Discharge status: Home / Self Care | Payer: Medicare HMO | Source: Ambulatory Visit | Attending: Family Medicine | Admitting: Family Medicine

## 2022-04-02 DIAGNOSIS — Z1231 Encounter for screening mammogram for malignant neoplasm of breast: Secondary | ICD-10-CM

## 2022-06-13 ENCOUNTER — Ambulatory Visit (INDEPENDENT_AMBULATORY_CARE_PROVIDER_SITE_OTHER): Payer: Medicare HMO | Admitting: Family Medicine

## 2022-06-13 ENCOUNTER — Encounter: Payer: Self-pay | Admitting: Family Medicine

## 2022-06-13 VITALS — BP 124/68 | HR 82 | Ht <= 58 in | Wt 116.0 lb

## 2022-06-13 DIAGNOSIS — H6121 Impacted cerumen, right ear: Secondary | ICD-10-CM | POA: Diagnosis not present

## 2022-06-13 DIAGNOSIS — E7800 Pure hypercholesterolemia, unspecified: Secondary | ICD-10-CM

## 2022-06-13 DIAGNOSIS — I10 Essential (primary) hypertension: Secondary | ICD-10-CM | POA: Diagnosis not present

## 2022-06-13 DIAGNOSIS — E079 Disorder of thyroid, unspecified: Secondary | ICD-10-CM | POA: Diagnosis not present

## 2022-06-13 MED ORDER — ROSUVASTATIN CALCIUM 20 MG PO TABS: 20.0000 mg | ORAL_TABLET | Freq: Every day | ORAL | 3 refills | Status: DC

## 2022-06-13 MED ORDER — LEVOTHYROXINE SODIUM 50 MCG PO TABS: 50.0000 ug | ORAL_TABLET | Freq: Every day | ORAL | 3 refills | Status: DC

## 2022-06-13 MED ORDER — LISINOPRIL 5 MG PO TABS: 5.0000 mg | ORAL_TABLET | Freq: Every day | ORAL | 3 refills | Status: DC

## 2022-06-13 NOTE — Progress Notes (Unsigned)
BP 124/68   Pulse 82   Ht '4\' 10"'$  (1.473 m)   Wt 52.6 kg   SpO2 96%   BMI 24.24 kg/m    Subjective:   Patient ID: Sherry Cooper, female    DOB: 02-27-1941, 82 y.o.   MRN: [id number]  HPI: Sherry Cooper is a 82 y.o. female presenting on 06/13/2022 for Medical Management of Chronic Issues, Hyperlipidemia, Hypertension, and Hypothyroidism Patient is also reporting hearing loss in her right ear due to reported earwax buildup x 1 month. Denies ear pain or discharge.   Hyperlipidemia This is a chronic problem. The problem is controlled. Exacerbating diseases include hypothyroidism. Patient is coming in for recheck of her hyperlipidemia. The patient is currently taking Rosuvastatin and Omega-3 Fatty Acids. Patient denies myalgias, history of liver damage, numbness, weakness and chest pain.   Hypertension Patient is coming in for BP recheck. Patient is currently taking Lisinopril. BP today is 124/68. Patient denies edema, cough, lightheadedness, dizziness, headaches, blurred vision, chest pains, shortness of breath, and weakness. Denies any side effects from medication and is content with current medication.   Hypothyroidism Patient is coming in for thyroid recheck. Patient denies hair loss, heat or cold intolerance, diarrhea, and constipation. Patient is currently taking levothyroxine 50 mcg.  Relevant past medical, surgical, family and social history reviewed and updated as indicated. Interim medical history since our last visit reviewed. Allergies and medications reviewed and updated.  Review of Systems Constitutional:  Negative for chills, fever, fatigue.  HENT:  Negative for ear discharge and ear pain. Patient does have impacted cerumen in right ear for 1 month and is reporting hearing loss from this. Eyes:  Negative for redness and visual disturbance.  Respiratory:  Negative for chest tightness and shortness of breath.   Cardiovascular:  Negative for chest pain and leg edema.   Genitourinary:  Negative for difficulty urinating and dysuria.  Skin:  Negative for rash.  Neurological:  Negative for light-headedness, dizziness, weakness, and headaches.  Psychiatric/Behavioral:  Negative for agitation, behavioral problems and dysphoric mood. Patient is not nervous/anxious.   All other systems reviewed and are negative.  Per HPI unless specifically indicated above   Allergies as of 06/13/2022   No Known Allergies      Medication List        Accurate as of June 13, 2022 11:10 AM. If you have any questions, ask your nurse or doctor.          aspirin 81 MG tablet Take 81 mg by mouth daily.   CALTRATE 600 PLUS-VIT D PO Take 1 tablet by mouth daily.   levothyroxine 50 MCG tablet Commonly known as: SYNTHROID Take 1 tablet (50 mcg total) by mouth daily before breakfast.   lisinopril 5 MG tablet Commonly known as: ZESTRIL Take 1 tablet (5 mg total) by mouth daily.   multivitamin tablet Take 1 tablet by mouth daily.   OMEGA 3 PO Take 2 g by mouth 2 (two) times daily.   rosuvastatin 20 MG tablet Commonly known as: CRESTOR Take 1 tablet (20 mg total) by mouth daily.         Objective:   BP 124/68   Pulse 82   Ht '4\' 10"'$  (1.473 m)   Wt 52.6 kg   SpO2 96%   BMI 24.24 kg/m   Wt Readings from Last 3 Encounters:  06/13/22 52.6 kg  12/11/21 54 kg  07/30/21 56.7 kg    Physical Exam Vitals and nursing note reviewed.  Constitutional:  General: She is not in acute distress.    Appearance: She is well-developed. She is not diaphoretic.  Eyes:     Conjunctiva/sclera: Conjunctivae normal.  Cardiovascular:     Rate and Rhythm: Normal rate and regular rhythm.     Heart sounds: Normal heart sounds. No murmur appreciated Abdominal:    General: No abdominal pain with palpation. Pulmonary:     Effort: Pulmonary effort is normal. No respiratory distress.     Breath sounds: Normal breath sounds. No wheezing.  Musculoskeletal:         General: No swelling or tenderness. Normal range of motion.  Skin:    General: Skin is warm and dry.     Findings: No rash.  Neurological:     Mental Status: She is alert and oriented to person, place, and time.     Coordination: Coordination normal.  Psychiatric:        Behavior: Behavior normal.       Assessment & Plan:  Patient was seen for hypertension, hyperlipidemia, hypothyroidism, and cerumen impaction of the right ear. Patient instructed to continue taking current medications and will check labs for any acute changes. Patient told to try Debrox OTC drops to soften earwax and see if wax softens and is removed. Return to office for ear flushing if this is not effective. Problem List Items Addressed This Visit       Cardiovascular and Mediastinum   Hypertension   Relevant Medications   lisinopril (ZESTRIL) 5 MG tablet   rosuvastatin (CRESTOR) 20 MG tablet   Other Relevant Orders   CMP14+EGFR   Lipid panel     Endocrine   Thyroid disease   Relevant Medications   levothyroxine (SYNTHROID) 50 MCG tablet   Other Relevant Orders   CBC with Differential/Platelet   TSH     Other   Hyperlipidemia - Primary   Relevant Medications   lisinopril (ZESTRIL) 5 MG tablet   rosuvastatin (CRESTOR) 20 MG tablet   Other Relevant Orders   CBC with Differential/Platelet   Lipid panel   Other Visit Diagnoses     Impacted cerumen of right ear            Follow up plan: Return in about 6 months (around 12/12/2022), or if symptoms worsen or fail to improve, for Thyroid and chronic disease recheck.  Counseling provided for all of the vaccine components Orders Placed This Encounter  Procedures   CBC with Differential/Platelet   CMP14+EGFR   Lipid panel   TSH   Hulan Amato, PA-S2 06/13/2022, 11:20 AM   I was personally present for all components of the history, physical exam and/or medical decision making.  I agree with the documentation performed by the PA student and  agree with assessment and plan above.  PA student was summoned on left. Caryl Pina, MD [facility] 06/22/2022, 10:29 PM    Caryl Pina, MD [facility] 06/13/2022, 11:10 AM

## 2022-06-13 NOTE — Progress Notes (Incomplete)
BP 124/68   Pulse 82   Ht '4\' 10"'$  (1.473 m)   Wt 52.6 kg   SpO2 96%   BMI 24.24 kg/m    Subjective:   Patient ID: Sherry Cooper, female    DOB: 1940-10-12, 82 y.o.   MRN: [id number]  HPI: Sherry Cooper is a 82 y.o. female presenting on 06/13/2022 for Medical Management of Chronic Issues, Hyperlipidemia, Hypertension, and Hypothyroidism Patient is also reporting hearing loss in her right ear due to reported earwax buildup and a "rolling sound" and some straining when using the bathroom. Denies ear pain, abdominal pain, nausea, vomiting, diarrhea, hematochezia, melena, change in bowel movements.    Patient is coming in for recheck of her hyperlipidemia. The patient is currently taking Rosuvastatin and Omega-3 Fatty Acids. Patient denies myalgias, history of liver damage, numbness, weakness and chest pain.    Hyperlipidemia This is a chronic problem. The problem is controlled. Exacerbating diseases include hypothyroidism.  Hypertension  Patient is coming in for thyroid recheck today as well. They deny any issues with hair changes or heat or cold problems or diarrhea or constipation. They deny any chest pain or palpitations. They are currently on levothyroxine 50 micrograms  Patient is currently on lisinopril, and their blood pressure today is 129/59. Patient denies any lightheadedness or dizziness. Patient denies headaches, blurred vision, chest pains, shortness of breath, or weakness. Denies any side effects from medication and is content with current medication.        Relevant past medical, surgical, family and social history reviewed and updated as indicated. Interim medical history since our last visit reviewed. Allergies and medications reviewed and updated.  Review of Systems  Per HPI unless specifically indicated above   Allergies as of 06/13/2022   No Known Allergies      Medication List        Accurate as of June 13, 2022 10:35 AM. If you have any questions,  ask your nurse or doctor.          aspirin 81 MG tablet Take 81 mg by mouth daily.   CALTRATE 600 PLUS-VIT D PO Take 1 tablet by mouth daily.   levothyroxine 50 MCG tablet Commonly known as: SYNTHROID Take 1 tablet (50 mcg total) by mouth daily before breakfast.   lisinopril 5 MG tablet Commonly known as: ZESTRIL Take 1 tablet (5 mg total) by mouth daily.   multivitamin tablet Take 1 tablet by mouth daily.   OMEGA 3 PO Take 2 g by mouth 2 (two) times daily.   rosuvastatin 20 MG tablet Commonly known as: CRESTOR Take 1 tablet (20 mg total) by mouth daily.         Objective:   BP 124/68   Pulse 82   Ht '4\' 10"'$  (1.473 m)   Wt 52.6 kg   SpO2 96%   BMI 24.24 kg/m   Wt Readings from Last 3 Encounters:  06/13/22 52.6 kg  12/11/21 54 kg  07/30/21 56.7 kg    Physical Exam  Results for orders placed or performed in visit on 12/11/21  CBC with Differential/Platelet  Result Value Ref Range   WBC 5.1 3.4 - 10.8 x10E3/uL   RBC 3.59 (L) 3.77 - 5.28 x10E6/uL   Hemoglobin 11.0 (L) 11.1 - 15.9 g/dL   Hematocrit 31.6 (L) 34.0 - 46.6 %   MCV 88 79 - 97 fL   MCH 30.6 26.6 - 33.0 pg   MCHC 34.8 31.5 - 35.7 g/dL   RDW 12.9 11.7 -  15.4 %   Platelets 222 150 - 450 x10E3/uL   Neutrophils 52 Not Estab. %   Lymphs 34 Not Estab. %   Monocytes 12 Not Estab. %   Eos 1 Not Estab. %   Basos 1 Not Estab. %   Neutrophils Absolute 2.7 1.4 - 7.0 x10E3/uL   Lymphocytes Absolute 1.7 0.7 - 3.1 x10E3/uL   Monocytes Absolute 0.6 0.1 - 0.9 x10E3/uL   EOS (ABSOLUTE) 0.0 0.0 - 0.4 x10E3/uL   Basophils Absolute 0.0 0.0 - 0.2 x10E3/uL   Immature Granulocytes 0 Not Estab. %   Immature Grans (Abs) 0.0 0.0 - 0.1 x10E3/uL  CMP14+EGFR  Result Value Ref Range   Glucose 81 70 - 99 mg/dL   BUN 20 8 - 27 mg/dL   Creatinine, Ser 1.09 (H) 0.57 - 1.00 mg/dL   eGFR 51 (L) >59 mL/min/1.73   BUN/Creatinine Ratio 18 12 - 28   Sodium 136 134 - 144 mmol/L   Potassium 4.6 3.5 - 5.2 mmol/L   Chloride  98 96 - 106 mmol/L   CO2 21 20 - 29 mmol/L   Calcium 10.3 8.7 - 10.3 mg/dL   Total Protein 6.7 6.0 - 8.5 g/dL   Albumin 4.4 3.8 - 4.8 g/dL   Globulin, Total 2.3 1.5 - 4.5 g/dL   Albumin/Globulin Ratio 1.9 1.2 - 2.2   Bilirubin Total 0.4 0.0 - 1.2 mg/dL   Alkaline Phosphatase 81 44 - 121 IU/L   AST 27 0 - 40 IU/L   ALT 18 0 - 32 IU/L  Lipid panel  Result Value Ref Range   Cholesterol, Total 140 100 - 199 mg/dL   Triglycerides 47 0 - 149 mg/dL   HDL 67 >39 mg/dL   VLDL Cholesterol Cal 11 5 - 40 mg/dL   LDL Chol Calc (NIH) 62 0 - 99 mg/dL   Chol/HDL Ratio 2.1 0.0 - 4.4 ratio  TSH  Result Value Ref Range   TSH 1.280 0.450 - 4.500 uIU/mL    Assessment & Plan:   Problem List Items Addressed This Visit       Cardiovascular and Mediastinum   Hypertension   Relevant Medications   lisinopril (ZESTRIL) 5 MG tablet   rosuvastatin (CRESTOR) 20 MG tablet   Other Relevant Orders   CMP14+EGFR   Lipid panel     Endocrine   Thyroid disease   Relevant Medications   levothyroxine (SYNTHROID) 50 MCG tablet   Other Relevant Orders   CBC with Differential/Platelet   TSH     Other   Hyperlipidemia - Primary   Relevant Medications   lisinopril (ZESTRIL) 5 MG tablet   rosuvastatin (CRESTOR) 20 MG tablet   Other Relevant Orders   CBC with Differential/Platelet   Lipid panel   Other Visit Diagnoses     Impacted cerumen of right ear            Follow up plan: Return in about 6 months (around 12/12/2022), or if symptoms worsen or fail to improve, for Thyroid and chronic disease recheck.  Counseling provided for all of the vaccine components Orders Placed This Encounter  Procedures  . CBC with Differential/Platelet  . CMP14+EGFR  . Lipid panel  . TSH    Caryl Pina, MD [facility] 06/13/2022, 10:35 AM

## 2022-06-14 LAB — CMP14+EGFR
ALT: 21 IU/L (ref 0–32)
AST: 28 IU/L (ref 0–40)
Albumin/Globulin Ratio: 1.9 (ref 1.2–2.2)
Albumin: 4.4 g/dL (ref 3.8–4.8)
Alkaline Phosphatase: 85 IU/L (ref 44–121)
BUN/Creatinine Ratio: 24 (ref 12–28)
BUN: 24 mg/dL (ref 8–27)
Bilirubin Total: 0.3 mg/dL (ref 0.0–1.2)
CO2: 22 mmol/L (ref 20–29)
Calcium: 10.2 mg/dL (ref 8.7–10.3)
Chloride: 104 mmol/L (ref 96–106)
Creatinine, Ser: 0.98 mg/dL (ref 0.57–1.00)
Globulin, Total: 2.3 g/dL (ref 1.5–4.5)
Glucose: 86 mg/dL (ref 70–99)
Potassium: 4.7 mmol/L (ref 3.5–5.2)
Sodium: 142 mmol/L (ref 134–144)
Total Protein: 6.7 g/dL (ref 6.0–8.5)
eGFR: 58 mL/min/{1.73_m2} — ABNORMAL LOW (ref >59)

## 2022-06-14 LAB — LIPID PANEL
Chol/HDL Ratio: 2.1 ratio (ref 0.0–4.4)
Cholesterol, Total: 134 mg/dL (ref 100–199)
HDL: 65 mg/dL (ref >39)
LDL Chol Calc (NIH): 59 mg/dL (ref 0–99)
Triglycerides: 42 mg/dL (ref 0–149)
VLDL Cholesterol Cal: 10 mg/dL (ref 5–40)

## 2022-06-14 LAB — CBC WITH DIFFERENTIAL/PLATELET
Basophils Absolute: 0.1 10*3/uL (ref 0.0–0.2)
Basos: 1 %
EOS (ABSOLUTE): 0.1 10*3/uL (ref 0.0–0.4)
Eos: 1 %
Hematocrit: 33.8 % — ABNORMAL LOW (ref 34.0–46.6)
Hemoglobin: 11.5 g/dL (ref 11.1–15.9)
Immature Grans (Abs): 0 10*3/uL (ref 0.0–0.1)
Immature Granulocytes: 0 %
Lymphocytes Absolute: 1.8 10*3/uL (ref 0.7–3.1)
Lymphs: 31 %
MCH: 30.6 pg (ref 26.6–33.0)
MCHC: 34 g/dL (ref 31.5–35.7)
MCV: 90 fL (ref 79–97)
Monocytes Absolute: 0.6 10*3/uL (ref 0.1–0.9)
Monocytes: 10 %
Neutrophils Absolute: 3.3 10*3/uL (ref 1.4–7.0)
Neutrophils: 57 %
Platelets: 224 10*3/uL (ref 150–450)
RBC: 3.76 x10E6/uL — ABNORMAL LOW (ref 3.77–5.28)
RDW: 12.3 % (ref 11.7–15.4)
WBC: 5.8 10*3/uL (ref 3.4–10.8)

## 2022-06-14 LAB — TSH: TSH: 2.42 u[IU]/mL (ref 0.450–4.500)

## 2022-06-19 ENCOUNTER — Telehealth: Payer: Self-pay | Admitting: Family Medicine

## 2022-06-19 NOTE — Telephone Encounter (Signed)
Dr. Warrick Parisian, please review for lab results.

## 2022-08-01 ENCOUNTER — Ambulatory Visit (INDEPENDENT_AMBULATORY_CARE_PROVIDER_SITE_OTHER): Payer: Medicare HMO

## 2022-08-01 VITALS — Ht 59.0 in | Wt 119.0 lb

## 2022-08-01 DIAGNOSIS — Z0000 Encounter for general adult medical examination without abnormal findings: Secondary | ICD-10-CM | POA: Diagnosis not present

## 2022-08-01 NOTE — Patient Instructions (Signed)
[name] , Thank you for taking time to come for your Medicare Wellness Visit. I appreciate your ongoing commitment to your health goals. Please review the following plan we discussed and let me know if I can assist you in the future.   These are the goals we discussed:  Goals      Have 3 meals a day     Try to eat 3 meals a day including fruits and vegetables. Low fat.cholesterol diet handouts given      Prevent falls     Stay active      Weight (lb) < 125 lb (56.7 kg)        This is a list of the screening recommended for you and due dates:  Health Maintenance  Topic Date Due   DTaP/Tdap/Td vaccine (5 - Td or Tdap) 02/03/2021   COVID-19 Vaccine (3 - 2023-24 season) 12/20/2021   Flu Shot  11/20/2022   Medicare Annual Wellness Visit  08/01/2023   Pneumonia Vaccine  Completed   DEXA scan (bone density measurement)  Completed   Zoster (Shingles) Vaccine  Completed   HPV Vaccine  Aged Out    Advanced directives: Advance directive discussed with you today. I have provided a copy for you to complete at home and have notarized. Once this is complete please bring a copy in to our office so we can scan it into your chart.   Conditions/risks identified: Aim for 30 minutes of exercise or brisk walking, 6-8 glasses of water, and 5 servings of fruits and vegetables each day.   Next appointment: Follow up in one year for your annual wellness visit    Preventive Care 65 Years and Older, Female Preventive care refers to lifestyle choices and visits with your health care provider that can promote health and wellness. What does preventive care include? A yearly physical exam. This is also called an annual well check. Dental exams once or twice a year. Routine eye exams. Ask your health care provider how often you should have your eyes checked. Personal lifestyle choices, including: Daily care of your teeth and gums. Regular physical activity. Eating a healthy diet. Avoiding tobacco and  drug use. Limiting alcohol use. Practicing safe sex. Taking low-dose aspirin every day. Taking vitamin and mineral supplements as recommended by your health care provider. What happens during an annual well check? The services and screenings done by your health care provider during your annual well check will depend on your age, overall health, lifestyle risk factors, and family history of disease. Counseling  Your health care provider may ask you questions about your: Alcohol use. Tobacco use. Drug use. Emotional well-being. Home and relationship well-being. Sexual activity. Eating habits. History of falls. Memory and ability to understand (cognition). Work and work Astronomer. Reproductive health. Screening  You may have the following tests or measurements: Height, weight, and BMI. Blood pressure. Lipid and cholesterol levels. These may be checked every 5 years, or more frequently if you are over 15 years old. Skin check. Lung cancer screening. You may have this screening every year starting at age 38 if you have a 30-pack-year history of smoking and currently smoke or have quit within the past 15 years. Fecal occult blood test (FOBT) of the stool. You may have this test every year starting at age 58. Flexible sigmoidoscopy or colonoscopy. You may have a sigmoidoscopy every 5 years or a colonoscopy every 10 years starting at age 62. Hepatitis C blood test. Hepatitis B blood test. Sexually  transmitted disease (STD) testing. Diabetes screening. This is done by checking your blood sugar (glucose) after you have not eaten for a while (fasting). You may have this done every 1-3 years. Bone density scan. This is done to screen for osteoporosis. You may have this done starting at age 38. Mammogram. This may be done every 1-2 years. Talk to your health care provider about how often you should have regular mammograms. Talk with your health care provider about your test results, treatment  options, and if necessary, the need for more tests. Vaccines  Your health care provider may recommend certain vaccines, such as: Influenza vaccine. This is recommended every year. Tetanus, diphtheria, and acellular pertussis (Tdap, Td) vaccine. You may need a Td booster every 10 years. Zoster vaccine. You may need this after age 72. Pneumococcal 13-valent conjugate (PCV13) vaccine. One dose is recommended after age 43. Pneumococcal polysaccharide (PPSV23) vaccine. One dose is recommended after age 70. Talk to your health care provider about which screenings and vaccines you need and how often you need them. This information is not intended to replace advice given to you by your health care provider. Make sure you discuss any questions you have with your health care provider. Document Released: 05/04/2015 Document Revised: 12/26/2015 Document Reviewed: 02/06/2015 Elsevier Interactive Patient Education  2017 [place] Prevention in the Home Falls can cause injuries. They can happen to people of all ages. There are many things you can do to make your home safe and to help prevent falls. What can I do on the outside of my home? Regularly fix the edges of walkways and driveways and fix any cracks. Remove anything that might make you trip as you walk through a door, such as a raised step or threshold. Trim any bushes or trees on the path to your home. Use bright outdoor lighting. Clear any walking paths of anything that might make someone trip, such as rocks or tools. Regularly check to see if handrails are loose or broken. Make sure that both sides of any steps have handrails. Any raised decks and porches should have guardrails on the edges. Have any leaves, snow, or ice cleared regularly. Use sand or salt on walking paths during winter. Clean up any spills in your garage right away. This includes oil or grease spills. What can I do in the bathroom? Use night lights. Install grab  bars by the toilet and in the tub and shower. Do not use towel bars as grab bars. Use non-skid mats or decals in the tub or shower. If you need to sit down in the shower, use a plastic, non-slip stool. Keep the floor dry. Clean up any water that spills on the floor as soon as it happens. Remove soap buildup in the tub or shower regularly. Attach bath mats securely with double-sided non-slip rug tape. Do not have throw rugs and other things on the floor that can make you trip. What can I do in the bedroom? Use night lights. Make sure that you have a light by your bed that is easy to reach. Do not use any sheets or blankets that are too big for your bed. They should not hang down onto the floor. Have a firm chair that has side arms. You can use this for support while you get dressed. Do not have throw rugs and other things on the floor that can make you trip. What can I do in the kitchen? Clean up any spills right away. Avoid  walking on wet floors. Keep items that you use a lot in easy-to-reach places. If you need to reach something above you, use a strong step stool that has a grab bar. Keep electrical cords out of the way. Do not use floor polish or wax that makes floors slippery. If you must use wax, use non-skid floor wax. Do not have throw rugs and other things on the floor that can make you trip. What can I do with my stairs? Do not leave any items on the stairs. Make sure that there are handrails on both sides of the stairs and use them. Fix handrails that are broken or loose. Make sure that handrails are as long as the stairways. Check any carpeting to make sure that it is firmly attached to the stairs. Fix any carpet that is loose or worn. Avoid having throw rugs at the top or bottom of the stairs. If you do have throw rugs, attach them to the floor with carpet tape. Make sure that you have a light switch at the top of the stairs and the bottom of the stairs. If you do not have them,  ask someone to add them for you. What else can I do to help prevent falls? Wear shoes that: Do not have high heels. Have rubber bottoms. Are comfortable and fit you well. Are closed at the toe. Do not wear sandals. If you use a stepladder: Make sure that it is fully opened. Do not climb a closed stepladder. Make sure that both sides of the stepladder are locked into place. Ask someone to hold it for you, if possible. Clearly mark and make sure that you can see: Any grab bars or handrails. First and last steps. Where the edge of each step is. Use tools that help you move around (mobility aids) if they are needed. These include: Canes. Walkers. Scooters. Crutches. Turn on the lights when you go into a dark area. Replace any light bulbs as soon as they burn out. Set up your furniture so you have a clear path. Avoid moving your furniture around. If any of your floors are uneven, fix them. If there are any pets around you, be aware of where they are. Review your medicines with your doctor. Some medicines can make you feel dizzy. This can increase your chance of falling. Ask your doctor what other things that you can do to help prevent falls. This information is not intended to replace advice given to you by your health care provider. Make sure you discuss any questions you have with your health care provider. Document Released: 02/01/2009 Document Revised: 09/13/2015 Document Reviewed: 05/12/2014 Elsevier Interactive Patient Education  2017 [employer].

## 2022-08-01 NOTE — Progress Notes (Signed)
Subjective:   Sherry Cooper is a 82 y.o. female who presents for Medicare Annual (Subsequent) preventive examination. I connected with  Sherry Cooper on 08/01/22 by a audio enabled telemedicine application and verified that I am speaking with the correct person using two identifiers.  Patient Location: Home  Provider Location: Home Office  I discussed the limitations of evaluation and management by telemedicine. The patient expressed understanding and agreed to proceed.  Review of Systems     Cardiac Risk Factors include: advanced age (>60men, >69 women);dyslipidemia;hypertension     Objective:    Today's Vitals   08/01/22 1117  Weight: 119 lb (54 kg)  Height: 4\' 11"  (1.499 m)   Body mass index is 24.04 kg/m.     08/01/2022   11:20 AM 07/30/2021   12:15 PM 07/27/2020    4:20 PM 06/23/2019   10:01 AM 06/21/2018    9:13 AM 06/02/2017   10:55 AM  Advanced Directives  Does Patient Have a Medical Advance Directive? No No No No No No  Would patient like information on creating a medical advance directive? No - Patient declined No - Patient declined Yes (MAU/Ambulatory/Procedural Areas - Information given) No - Patient declined Yes (MAU/Ambulatory/Procedural Areas - Information given) Yes (MAU/Ambulatory/Procedural Areas - Information given)    Current Medications (verified) Outpatient Encounter Medications as of 08/01/2022  Medication Sig   aspirin 81 MG tablet Take 81 mg by mouth daily.   Calcium-Vitamin D (CALTRATE 600 PLUS-VIT D PO) Take 1 tablet by mouth daily.   levothyroxine (SYNTHROID) 50 MCG tablet Take 1 tablet (50 mcg total) by mouth daily before breakfast.   lisinopril (ZESTRIL) 5 MG tablet Take 1 tablet (5 mg total) by mouth daily.   Multiple Vitamin (MULTIVITAMIN) tablet Take 1 tablet by mouth daily.   Omega-3 Fatty Acids (OMEGA 3 PO) Take 2 g by mouth 2 (two) times daily.   rosuvastatin (CRESTOR) 20 MG tablet Take 1 tablet (20 mg total) by mouth daily.   No  facility-administered encounter medications on file as of 08/01/2022.    Allergies (verified) Patient has no known allergies.   History: Past Medical History:  Diagnosis Date   Arthritis    Cataract    Bilateral  Surgery on both cant remember dates    Hyperlipidemia    Hypertension    Thyroid disease    Past Surgical History:  Procedure Laterality Date   ABDOMINAL HYSTERECTOMY  1987   CHOLECYSTECTOMY  05/31/2002   laparoscopic   DILATION AND CURETTAGE OF UTERUS  1987   EYE SURGERY Bilateral    cataracts removed    Family History  Problem Relation Age of Onset   Congestive Heart Failure Father    Cancer Sister        uterine and breast and lung   Breast cancer Sister    Diabetes Sister    Migraines Daughter    Diabetes Brother    COPD Brother    Colon cancer Neg Hx    Stomach cancer Neg Hx    Social History   Socioeconomic History   Marital status: Widowed    Spouse name: Not on file   Number of children: 2   Years of education: 90   Highest education level: 12th grade  Occupational History   Occupation: Retired     Comment: Barrister's clerk   Tobacco Use   Smoking status: Never   Smokeless tobacco: Never  Vaping Use   Vaping Use: Never used  Substance and  Sexual Activity   Alcohol use: No   Drug use: No   Sexual activity: Not Currently  Other Topics Concern   Not on file  Social History Narrative   Lives alone   Social Determinants of Health   Financial Resource Strain: Low Risk  (08/01/2022)   Overall Financial Resource Strain (CARDIA)    Difficulty of Paying Living Expenses: Not hard at all  Food Insecurity: No Food Insecurity (08/01/2022)   Hunger Vital Sign    Worried About Running Out of Food in the Last Year: Never true    Ran Out of Food in the Last Year: Never true  Transportation Needs: No Transportation Needs (07/30/2021)   PRAPARE - Administrator, Civil Service (Medical): No    Lack of Transportation (Non-Medical): No   Physical Activity: Insufficiently Active (08/01/2022)   Exercise Vital Sign    Days of Exercise per Week: 3 days    Minutes of Exercise per Session: 30 min  Stress: No Stress Concern Present (08/01/2022)   [employer] of Occupational Health - Occupational Stress Questionnaire    Feeling of Stress : Not at all  Social Connections: Moderately Isolated (08/01/2022)   Social Connection and Isolation Panel [NHANES]    Frequency of Communication with Friends and Family: More than three times a week    Frequency of Social Gatherings with Friends and Family: More than three times a week    Attends Religious Services: More than 4 times per year    Active Member of [employer] or Organizations: No    Attends Banker Meetings: Never    Marital Status: Widowed    Tobacco Counseling Counseling given: Not Answered   Clinical Intake:  Pre-visit preparation completed: Yes  Pain : No/denies pain     Nutritional Risks: None Diabetes: No  How often do you need to have someone help you when you read instructions, pamphlets, or other written materials from your doctor or pharmacy?: 1 - Never  Diabetic?no   Interpreter Needed?: No  Information entered by :: Renie Ora, LPN   Activities of Daily Living    08/01/2022   11:20 AM  In your present state of health, do you have any difficulty performing the following activities:  Hearing? 0  Vision? 0  Difficulty concentrating or making decisions? 0  Walking or climbing stairs? 0  Dressing or bathing? 0  Doing errands, shopping? 0  Preparing Food and eating ? N  Using the Toilet? N  In the past six months, have you accidently leaked urine? N  Do you have problems with loss of bowel control? N  Managing your Medications? N  Managing your Finances? N  Housekeeping or managing your Housekeeping? N    Patient Care Team: Dettinger, Elige Radon, MD as PCP - General (Family Medicine) Ernesto Rutherford, MD as Consulting Physician  (Ophthalmology)  Indicate any recent Medical Services you may have received from other than [facility] providers in the past year (date may be approximate).     Assessment:   This is a routine wellness examination for Sherry Cooper.  Hearing/Vision screen Vision Screening - Comments:: Wears rx glasses - up to date with routine eye exams with  Dr.Groat   Dietary issues and exercise activities discussed: Current Exercise Habits: Home exercise routine, Type of exercise: walking, Time (Minutes): 30, Frequency (Times/Week): 3, Weekly Exercise (Minutes/Week): 90, Intensity: Mild, Exercise limited by: None identified   Goals Addressed  This Visit's Progress    Have 3 meals a day   On track    Try to eat 3 meals a day including fruits and vegetables. Low fat.cholesterol diet handouts given        Depression Screen    08/01/2022   11:19 AM 06/13/2022   10:11 AM 12/11/2021    9:12 AM 07/30/2021   12:04 PM 06/12/2021   10:04 AM 12/10/2020    9:20 AM 07/27/2020    4:24 PM  PHQ 2/9 Scores  PHQ - 2 Score 0 0 0 0 0 0 0  PHQ- 9 Score 0   0 0      Fall Risk    08/01/2022   11:18 AM 06/13/2022   10:11 AM 12/11/2021    9:12 AM 07/30/2021   12:03 PM 06/12/2021   10:04 AM  Fall Risk   Falls in the past year? 0 0 0 0 0  Number falls in past yr: 0   0   Injury with Fall? 0   0   Risk for fall due to : No Fall Risks   No Fall Risks   Follow up Falls prevention discussed   Falls prevention discussed     FALL RISK PREVENTION PERTAINING TO THE HOME:  Any stairs in or around the home? Yes  If so, are there any without handrails? No  Home free of loose throw rugs in walkways, pet beds, electrical cords, etc? Yes  Adequate lighting in your home to reduce risk of falls? Yes   ASSISTIVE DEVICES UTILIZED TO PREVENT FALLS:  Life alert? No  Use of a cane, walker or w/c? No  Grab bars in the bathroom? No  Shower chair or bench in shower? No  Elevated toilet seat or a handicapped toilet? No        06/21/2018    9:17 AM 06/02/2017    9:41 AM  MMSE - Mini Mental State Exam  Orientation to time 5 5  Orientation to Place 5 5  Registration 3 3  Attention/ Calculation 5 5  Recall 3 3  Language- name 2 objects 2 2  Language- repeat 1 1  Language- follow 3 step command 3 3  Language- read & follow direction 1 1  Write a sentence 1 1  Copy design 1 1  Total score 30 30        08/01/2022   11:20 AM 07/30/2021   12:06 PM 06/23/2019   10:05 AM  6CIT Screen  What Year? 0 points 0 points 0 points  What month? 0 points 0 points 0 points  What time? 0 points 0 points 0 points  Count back from 20 0 points 0 points 0 points  Months in reverse 0 points 0 points 0 points  Repeat phrase 0 points 0 points 0 points  Total Score 0 points 0 points 0 points    Immunizations Immunization History  Administered Date(s) Administered   Fluad Quad(high Dose 65+) 02/04/2019, 02/21/2020   Influenza, High Dose Seasonal PF 02/01/2016, 02/01/2016, 02/10/2017, 02/10/2017, 02/22/2018, 02/22/2018   Influenza,inj,Quad PF,6+ Mos 01/24/2013   Influenza-Unspecified 02/28/2004, 01/31/2005, 01/31/2006, 02/01/2007, 05/10/2008, 01/19/2009, 03/25/2010, 02/04/2011, 02/11/2012, 01/25/2013, 02/01/2014, 03/06/2015, 03/21/2021   Moderna Sars-Covid-2 Vaccination 05/17/2019, 06/14/2019   Pneumococcal Conjugate-13 12/22/2013   Pneumococcal Polysaccharide-23 02/11/2012   Pneumococcal-Unspecified 11/20/2010   Td 03/09/2007, 02/04/2011   Tdap 07/21/2006, 02/04/2011   Zoster Recombinat (Shingrix) 09/09/2017, 11/25/2017   Zoster, Live 04/21/2006, 04/20/2012    TDAP status: Due, Education has been  provided regarding the importance of this vaccine. Advised may receive this vaccine at local pharmacy or Health Dept. Aware to provide a copy of the vaccination record if obtained from local pharmacy or Health Dept. Verbalized acceptance and understanding.  Flu Vaccine status: Declined, Education has been provided regarding the  importance of this vaccine but patient still declined. Advised may receive this vaccine at local pharmacy or Health Dept. Aware to provide a copy of the vaccination record if obtained from local pharmacy or Health Dept. Verbalized acceptance and understanding.  Pneumococcal vaccine status: Up to date  Covid-19 vaccine status: Completed vaccines  Qualifies for Shingles Vaccine? Yes   Zostavax completed No   Shingrix Completed?: No.    Education has been provided regarding the importance of this vaccine. Patient has been advised to call insurance company to determine out of pocket expense if they have not yet received this vaccine. Advised may also receive vaccine at local pharmacy or Health Dept. Verbalized acceptance and understanding.  Screening Tests Health Maintenance  Topic Date Due   DTaP/Tdap/Td (5 - Td or Tdap) 02/03/2021   COVID-19 Vaccine (3 - 2023-24 season) 12/20/2021   INFLUENZA VACCINE  11/20/2022   Medicare Annual Wellness (AWV)  08/01/2023   Pneumonia Vaccine 66+ Years old  Completed   DEXA SCAN  Completed   Zoster Vaccines- Shingrix  Completed   HPV VACCINES  Aged Out    Health Maintenance  Health Maintenance Due  Topic Date Due   DTaP/Tdap/Td (5 - Td or Tdap) 02/03/2021   COVID-19 Vaccine (3 - 2023-24 season) 12/20/2021    Colorectal cancer screening: No longer required.   Mammogram status: No longer required due to age.  Bone Density status: Ordered declined . Pt provided with contact info and advised to call to schedule appt.  Lung Cancer Screening: (Low Dose CT Chest recommended if Age 61-80 years, 30 pack-year currently smoking OR have quit w/in 15years.) does not qualify.   Lung Cancer Screening Referral: n/a  Additional Screening:  Hepatitis C Screening: does not qualify;   Vision Screening: Recommended annual ophthalmology exams for early detection of glaucoma and other disorders of the eye. Is the patient up to date with their annual eye exam?   Yes  Who is the provider or what is the name of the office in which the patient attends annual eye exams? Dr.groat  If pt is not established with a provider, would they like to be referred to a provider to establish care? No .   Dental Screening: Recommended annual dental exams for proper oral hygiene  Community Resource Referral / Chronic Care Management: CRR required this visit?  No   CCM required this visit?  No      Plan:     I have personally reviewed and noted the following in the patient's chart:   Medical and social history Use of alcohol, tobacco or illicit drugs  Current medications and supplements including opioid prescriptions. Patient is not currently taking opioid prescriptions. Functional ability and status Nutritional status Physical activity Advanced directives List of other physicians Hospitalizations, surgeries, and ER visits in previous 12 months Vitals Screenings to include cognitive, depression, and falls Referrals and appointments  In addition, I have reviewed and discussed with patient certain preventive protocols, quality metrics, and best practice recommendations. A written personalized care plan for preventive services as well as general preventive health recommendations were provided to patient.     Lorrene Reid, LPN   7/62/2633   Nurse Notes:  Due Tdap Vaccine

## 2022-11-09 DIAGNOSIS — R5383 Other fatigue: Secondary | ICD-10-CM | POA: Diagnosis not present

## 2022-11-09 DIAGNOSIS — M25562 Pain in left knee: Secondary | ICD-10-CM | POA: Diagnosis not present

## 2022-11-09 DIAGNOSIS — W1830XA Fall on same level, unspecified, initial encounter: Secondary | ICD-10-CM | POA: Diagnosis not present

## 2022-11-09 DIAGNOSIS — M25462 Effusion, left knee: Secondary | ICD-10-CM | POA: Diagnosis not present

## 2022-11-09 DIAGNOSIS — M25561 Pain in right knee: Secondary | ICD-10-CM | POA: Diagnosis not present

## 2022-11-09 DIAGNOSIS — M25461 Effusion, right knee: Secondary | ICD-10-CM | POA: Diagnosis not present

## 2022-11-10 DIAGNOSIS — R7989 Other specified abnormal findings of blood chemistry: Secondary | ICD-10-CM | POA: Diagnosis not present

## 2022-11-10 DIAGNOSIS — R634 Abnormal weight loss: Secondary | ICD-10-CM | POA: Diagnosis not present

## 2022-11-10 DIAGNOSIS — D649 Anemia, unspecified: Secondary | ICD-10-CM | POA: Diagnosis not present

## 2022-11-10 DIAGNOSIS — R5383 Other fatigue: Secondary | ICD-10-CM | POA: Diagnosis not present

## 2022-11-19 DIAGNOSIS — R5383 Other fatigue: Secondary | ICD-10-CM | POA: Diagnosis not present

## 2022-11-19 DIAGNOSIS — D649 Anemia, unspecified: Secondary | ICD-10-CM | POA: Diagnosis not present

## 2022-11-19 DIAGNOSIS — R7989 Other specified abnormal findings of blood chemistry: Secondary | ICD-10-CM | POA: Diagnosis not present

## 2022-12-17 ENCOUNTER — Encounter: Payer: Self-pay | Admitting: Family Medicine

## 2022-12-17 ENCOUNTER — Ambulatory Visit: Payer: Medicare HMO | Admitting: Family Medicine

## 2022-12-17 VITALS — BP 136/69 | HR 80 | Ht 59.0 in | Wt 113.0 lb

## 2022-12-17 DIAGNOSIS — E079 Disorder of thyroid, unspecified: Secondary | ICD-10-CM

## 2022-12-17 DIAGNOSIS — I10 Essential (primary) hypertension: Secondary | ICD-10-CM | POA: Diagnosis not present

## 2022-12-17 DIAGNOSIS — D649 Anemia, unspecified: Secondary | ICD-10-CM | POA: Diagnosis not present

## 2022-12-17 DIAGNOSIS — E7800 Pure hypercholesterolemia, unspecified: Secondary | ICD-10-CM

## 2022-12-17 DIAGNOSIS — Z23 Encounter for immunization: Secondary | ICD-10-CM | POA: Diagnosis not present

## 2022-12-17 LAB — LIPID PANEL
Chol/HDL Ratio: 2.1 ratio (ref 0.0–4.4)
Cholesterol, Total: 129 mg/dL (ref 100–199)
HDL: 62 mg/dL (ref >39)
LDL Chol Calc (NIH): 57 mg/dL (ref 0–99)
Triglycerides: 39 mg/dL (ref 0–149)
VLDL Cholesterol Cal: 10 mg/dL (ref 5–40)

## 2022-12-17 LAB — CMP14+EGFR
ALT: 19 IU/L (ref 0–32)
AST: 33 IU/L (ref 0–40)
Albumin: 4.2 g/dL (ref 3.7–4.7)
Alkaline Phosphatase: 84 IU/L (ref 44–121)
BUN/Creatinine Ratio: 22 (ref 12–28)
BUN: 19 mg/dL (ref 8–27)
Bilirubin Total: 0.3 mg/dL (ref 0.0–1.2)
CO2: 21 mmol/L (ref 20–29)
Calcium: 9.8 mg/dL (ref 8.7–10.3)
Chloride: 101 mmol/L (ref 96–106)
Creatinine, Ser: 0.86 mg/dL (ref 0.57–1.00)
Globulin, Total: 2.5 g/dL (ref 1.5–4.5)
Glucose: 91 mg/dL (ref 70–99)
Potassium: 4.5 mmol/L (ref 3.5–5.2)
Sodium: 137 mmol/L (ref 134–144)
Total Protein: 6.7 g/dL (ref 6.0–8.5)
eGFR: 68 mL/min/{1.73_m2} (ref >59)

## 2022-12-17 LAB — TSH: TSH: 3.49 u[IU]/mL (ref 0.450–4.500)

## 2022-12-17 LAB — CBC WITH DIFFERENTIAL/PLATELET
Basophils Absolute: 0 10*3/uL (ref 0.0–0.2)
Basos: 1 %
EOS (ABSOLUTE): 0.1 10*3/uL (ref 0.0–0.4)
Eos: 1 %
Hematocrit: 33 % — ABNORMAL LOW (ref 34.0–46.6)
Hemoglobin: 11 g/dL — ABNORMAL LOW (ref 11.1–15.9)
Immature Grans (Abs): 0 10*3/uL (ref 0.0–0.1)
Immature Granulocytes: 0 %
Lymphocytes Absolute: 2.3 10*3/uL (ref 0.7–3.1)
Lymphs: 37 %
MCH: 30.1 pg (ref 26.6–33.0)
MCHC: 33.3 g/dL (ref 31.5–35.7)
MCV: 90 fL (ref 79–97)
Monocytes Absolute: 0.6 10*3/uL (ref 0.1–0.9)
Monocytes: 10 %
Neutrophils Absolute: 3.3 10*3/uL (ref 1.4–7.0)
Neutrophils: 51 %
Platelets: 250 10*3/uL (ref 150–450)
RBC: 3.66 x10E6/uL — ABNORMAL LOW (ref 3.77–5.28)
RDW: 13.6 % (ref 11.7–15.4)
WBC: 6.3 10*3/uL (ref 3.4–10.8)

## 2022-12-17 NOTE — Progress Notes (Signed)
BP 136/69   Pulse 80   Ht 4\' 11"  (1.499 m)   Wt 113 lb (51.3 kg)   SpO2 97%   BMI 22.82 kg/m    Subjective:   Patient ID: Sherry Cooper, female    DOB: 1940-08-25, 82 y.o.   MRN: [id number]  HPI: Sherry Cooper is a 82 y.o. female presenting on 12/17/2022 for Medical Management of Chronic Issues, Hypertension, Hyperlipidemia, and Hypothyroidism   HPI Hypertension Patient is currently on lisinopril, and their blood pressure today is 136/69. Patient denies any lightheadedness or dizziness. Patient denies headaches, blurred vision, chest pains, shortness of breath, or weakness. Denies any side effects from medication and is content with current medication.   Hyperlipidemia Patient is coming in for recheck of his hyperlipidemia. The patient is currently taking Crestor. They deny any issues with myalgias or history of liver damage from it. They deny any focal numbness or weakness or chest pain.   Hypothyroidism recheck Patient is coming in for thyroid recheck today as well. They deny any issues with hair changes or heat or cold problems or diarrhea or constipation. They deny any chest pain or palpitations. They are currently on levothyroxine 50 micrograms   Anemia recheck Patient is coming in for anemia recheck.  She was recently seen last month for some other issues and then was found to be anemic with a hemoglobin of 9.9 and then was set up with gastroenterology and they are planning to do an EGD and a colonoscopy because they are concerned that she has a spot that is bleeding, possibly a polyp.  Relevant past medical, surgical, family and social history reviewed and updated as indicated. Interim medical history since our last visit reviewed. Allergies and medications reviewed and updated.  Review of Systems  Constitutional:  Negative for chills and fever.  Eyes:  Negative for visual disturbance.  Respiratory:  Negative for chest tightness and shortness of breath.   Cardiovascular:   Negative for chest pain and leg swelling.  Gastrointestinal:  Negative for abdominal pain, blood in stool (She has not noticed any blood in her stool), constipation, diarrhea, nausea and vomiting.  Musculoskeletal:  Negative for back pain and gait problem.  Skin:  Negative for rash.  Neurological:  Negative for light-headedness and headaches.  Psychiatric/Behavioral:  Negative for agitation and behavioral problems.   All other systems reviewed and are negative.   Per HPI unless specifically indicated above   Allergies as of 12/17/2022   No Known Allergies      Medication List        Accurate as of December 17, 2022  9:59 AM. If you have any questions, ask your nurse or doctor.          STOP taking these medications    aspirin 81 MG tablet Stopped by: Elige Radon Ardit Danh   OMEGA 3 PO Stopped by: Elige Radon Paytan Recine       TAKE these medications    CALTRATE 600 PLUS-VIT D PO Take 1 tablet by mouth daily.   ferrous sulfate 324 MG Tbec Take 324 mg by mouth every other day.   levothyroxine 50 MCG tablet Commonly known as: SYNTHROID Take 1 tablet (50 mcg total) by mouth daily before breakfast.   lisinopril 5 MG tablet Commonly known as: ZESTRIL Take 1 tablet (5 mg total) by mouth daily.   multivitamin tablet Take 1 tablet by mouth daily.   rosuvastatin 20 MG tablet Commonly known as: CRESTOR Take 1 tablet (20 mg total) by  mouth daily.         Objective:   BP 136/69   Pulse 80   Ht 4\' 11"  (1.499 m)   Wt 113 lb (51.3 kg)   SpO2 97%   BMI 22.82 kg/m   Wt Readings from Last 3 Encounters:  12/17/22 113 lb (51.3 kg)  08/01/22 119 lb (54 kg)  06/13/22 116 lb (52.6 kg)    Physical Exam Vitals and nursing note reviewed.  Constitutional:      General: She is not in acute distress.    Appearance: Normal appearance. She is well-developed. She is not diaphoretic.  Eyes:     Conjunctiva/sclera: Conjunctivae normal.  Cardiovascular:     Rate and Rhythm:  Normal rate and regular rhythm.     Heart sounds: Normal heart sounds. No murmur heard. Pulmonary:     Effort: Pulmonary effort is normal. No respiratory distress.     Breath sounds: Normal breath sounds. No wheezing.  Abdominal:     General: Abdomen is flat. Bowel sounds are normal. There is no distension.     Palpations: Abdomen is soft. There is no mass.     Tenderness: There is no abdominal tenderness. There is no guarding or rebound.  Musculoskeletal:        General: No tenderness. Normal range of motion.  Skin:    General: Skin is warm and dry.     Findings: No rash.  Neurological:     Mental Status: She is alert and oriented to person, place, and time.     Coordination: Coordination normal.  Psychiatric:        Behavior: Behavior normal.       Assessment & Plan:   Problem List Items Addressed This Visit       Cardiovascular and Mediastinum   Hypertension   Relevant Orders   CMP14+EGFR   Lipid panel     Endocrine   Thyroid disease - Primary   Relevant Orders   TSH     Other   Hyperlipidemia   Relevant Orders   CMP14+EGFR   Lipid panel   Anemia   Relevant Medications   ferrous sulfate 324 MG TBEC   Other Relevant Orders   CBC with Differential/Platelet    She has lost some weight had some recent anemia, they are concerned about a possible bleeding ulcer versus polyp and she is planning for colonoscopy and EGD with gastroenterology.   Follow up plan: Return in about 6 months (around 06/19/2023), or if symptoms worsen or fail to improve, for Hypertension anemia recheck.  Counseling provided for all of the vaccine components Orders Placed This Encounter  Procedures   CBC with Differential/Platelet   CMP14+EGFR   Lipid panel   TSH    Arville Care, MD [facility] 12/17/2022, 9:59 AM

## 2022-12-17 NOTE — Addendum Note (Signed)
Addended by: Dorene Sorrow on: 12/17/2022 04:39 PM   Modules accepted: Orders

## 2023-02-16 ENCOUNTER — Other Ambulatory Visit: Payer: Self-pay | Admitting: Family Medicine

## 2023-02-16 DIAGNOSIS — Z1231 Encounter for screening mammogram for malignant neoplasm of breast: Secondary | ICD-10-CM

## 2023-03-11 DIAGNOSIS — H43822 Vitreomacular adhesion, left eye: Secondary | ICD-10-CM | POA: Diagnosis not present

## 2023-03-11 DIAGNOSIS — H04122 Dry eye syndrome of left lacrimal gland: Secondary | ICD-10-CM | POA: Diagnosis not present

## 2023-03-11 DIAGNOSIS — H43813 Vitreous degeneration, bilateral: Secondary | ICD-10-CM | POA: Diagnosis not present

## 2023-03-11 DIAGNOSIS — H40013 Open angle with borderline findings, low risk, bilateral: Secondary | ICD-10-CM | POA: Diagnosis not present

## 2023-03-11 DIAGNOSIS — H1045 Other chronic allergic conjunctivitis: Secondary | ICD-10-CM | POA: Diagnosis not present

## 2023-03-25 DIAGNOSIS — R195 Other fecal abnormalities: Secondary | ICD-10-CM | POA: Diagnosis not present

## 2023-03-25 DIAGNOSIS — Z79899 Other long term (current) drug therapy: Secondary | ICD-10-CM | POA: Diagnosis not present

## 2023-03-25 DIAGNOSIS — D649 Anemia, unspecified: Secondary | ICD-10-CM | POA: Diagnosis not present

## 2023-03-25 DIAGNOSIS — K2950 Unspecified chronic gastritis without bleeding: Secondary | ICD-10-CM | POA: Diagnosis not present

## 2023-03-25 DIAGNOSIS — B9681 Helicobacter pylori [H. pylori] as the cause of diseases classified elsewhere: Secondary | ICD-10-CM | POA: Diagnosis not present

## 2023-03-25 DIAGNOSIS — K3189 Other diseases of stomach and duodenum: Secondary | ICD-10-CM | POA: Diagnosis not present

## 2023-03-25 DIAGNOSIS — E039 Hypothyroidism, unspecified: Secondary | ICD-10-CM | POA: Diagnosis not present

## 2023-03-25 DIAGNOSIS — I10 Essential (primary) hypertension: Secondary | ICD-10-CM | POA: Diagnosis not present

## 2023-04-08 ENCOUNTER — Ambulatory Visit
Admission: RE | Admit: 2023-04-08 | Discharge: 2023-04-08 | Disposition: A | Discharge status: Home / Self Care | Payer: Medicare HMO | Source: Ambulatory Visit | Attending: Family Medicine | Admitting: Family Medicine

## 2023-04-08 DIAGNOSIS — Z1231 Encounter for screening mammogram for malignant neoplasm of breast: Secondary | ICD-10-CM

## 2023-04-14 DIAGNOSIS — A048 Other specified bacterial intestinal infections: Secondary | ICD-10-CM | POA: Diagnosis not present

## 2023-05-27 DIAGNOSIS — R634 Abnormal weight loss: Secondary | ICD-10-CM | POA: Diagnosis not present

## 2023-05-27 DIAGNOSIS — D649 Anemia, unspecified: Secondary | ICD-10-CM | POA: Diagnosis not present

## 2023-05-27 DIAGNOSIS — A048 Other specified bacterial intestinal infections: Secondary | ICD-10-CM | POA: Diagnosis not present

## 2023-06-15 ENCOUNTER — Other Ambulatory Visit: Payer: Self-pay | Admitting: Family Medicine

## 2023-06-15 DIAGNOSIS — E079 Disorder of thyroid, unspecified: Secondary | ICD-10-CM

## 2023-06-22 ENCOUNTER — Ambulatory Visit (INDEPENDENT_AMBULATORY_CARE_PROVIDER_SITE_OTHER): Payer: Medicare HMO | Admitting: Family Medicine

## 2023-06-22 ENCOUNTER — Encounter: Payer: Self-pay | Admitting: Family Medicine

## 2023-06-22 VITALS — BP 120/68 | HR 85 | Ht 59.0 in | Wt 113.0 lb

## 2023-06-22 DIAGNOSIS — E7800 Pure hypercholesterolemia, unspecified: Secondary | ICD-10-CM

## 2023-06-22 DIAGNOSIS — I10 Essential (primary) hypertension: Secondary | ICD-10-CM

## 2023-06-22 DIAGNOSIS — D649 Anemia, unspecified: Secondary | ICD-10-CM

## 2023-06-22 DIAGNOSIS — E079 Disorder of thyroid, unspecified: Secondary | ICD-10-CM | POA: Diagnosis not present

## 2023-06-22 LAB — LIPID PANEL

## 2023-06-22 MED ORDER — ROSUVASTATIN CALCIUM 20 MG PO TABS: 20.0000 mg | ORAL_TABLET | Freq: Every day | ORAL | 3 refills | Status: AC

## 2023-06-22 MED ORDER — LISINOPRIL 5 MG PO TABS
5.0000 mg | ORAL_TABLET | Freq: Every day | ORAL | 3 refills | Status: AC
Start: 2023-06-22 — End: ?

## 2023-06-22 NOTE — Progress Notes (Signed)
 BP 120/68   Pulse 85   Ht 4\' 11"  (1.499 m)   Wt 113 lb (51.3 kg)   SpO2 95%   BMI 22.82 kg/m    Subjective:   Patient ID: Sherry Cooper, female    DOB: July 20, 1940, 83 y.o.   MRN: [id number]  HPI: Sherry Cooper is a 83 y.o. female presenting on 06/22/2023 for Medical Management of Chronic Issues, Hypertension, Hyperlipidemia, and Hypothyroidism   HPI Hypothyroidism recheck Patient is coming in for thyroid recheck today as well. They deny any issues with hair changes or heat or cold problems or diarrhea or constipation. They deny any chest pain or palpitations. They are currently on levothyroxine 50 micrograms   Hypertension Patient is currently on lisinopril, and their blood pressure today is 120/58. Patient denies any lightheadedness or dizziness. Patient denies headaches, blurred vision, chest pains, shortness of breath, or weakness. Denies any side effects from medication and is content with current medication.   Hyperlipidemia Patient is coming in for recheck of his hyperlipidemia. The patient is currently taking rosuvastatin. They deny any issues with myalgias or history of liver damage from it. They deny any focal numbness or weakness or chest pain.   Anemia recheck Patient is coming in today for anemia recheck.  She had a mild amount anemia a little bit lower than her previous times last time.  She denies any blood in her stool.  She denies any lightheadedness or dizziness or chest pains.  She is currently taking iron supplementation daily, she sees a specialist and actually said that she did stop her iron supplementation after the last visit with them  Relevant past medical, surgical, family and social history reviewed and updated as indicated. Interim medical history since our last visit reviewed. Allergies and medications reviewed and updated.  Review of Systems  Constitutional:  Negative for chills and fever.  Eyes:  Negative for visual disturbance.  Respiratory:  Negative  for chest tightness and shortness of breath.   Cardiovascular:  Negative for chest pain and leg swelling.  Skin:  Negative for rash.  Neurological:  Negative for dizziness, light-headedness and headaches.  Psychiatric/Behavioral:  Negative for agitation and behavioral problems.   All other systems reviewed and are negative.   Per HPI unless specifically indicated above   Allergies as of 06/22/2023   No Known Allergies      Medication List        Accurate as of June 22, 2023  9:47 AM. If you have any questions, ask your nurse or doctor.          CALTRATE 600 PLUS-VIT D PO Take 1 tablet by mouth daily.   ferrous sulfate 324 MG Tbec Take 324 mg by mouth every other day.   levothyroxine 50 MCG tablet Commonly known as: SYNTHROID TAKE 1 TABLET BY MOUTH ONCE DAILY BEFORE BREAKFAST   lisinopril 5 MG tablet Commonly known as: ZESTRIL Take 1 tablet (5 mg total) by mouth daily.   multivitamin tablet Take 1 tablet by mouth daily.   rosuvastatin 20 MG tablet Commonly known as: CRESTOR Take 1 tablet (20 mg total) by mouth daily.         Objective:   BP 120/68   Pulse 85   Ht 4\' 11"  (1.499 m)   Wt 113 lb (51.3 kg)   SpO2 95%   BMI 22.82 kg/m   Wt Readings from Last 3 Encounters:  06/22/23 113 lb (51.3 kg)  12/17/22 113 lb (51.3 kg)  08/01/22 119  lb (54 kg)    Physical Exam Vitals and nursing note reviewed.  Constitutional:      General: She is not in acute distress.    Appearance: She is well-developed. She is not diaphoretic.  Eyes:     Conjunctiva/sclera: Conjunctivae normal.  Cardiovascular:     Rate and Rhythm: Normal rate and regular rhythm.     Heart sounds: Normal heart sounds. No murmur heard. Pulmonary:     Effort: Pulmonary effort is normal. No respiratory distress.     Breath sounds: Normal breath sounds. No wheezing.  Musculoskeletal:        General: No swelling.  Skin:    General: Skin is warm and dry.     Findings: No rash.   Neurological:     Mental Status: She is alert and oriented to person, place, and time.     Coordination: Coordination normal.  Psychiatric:        Behavior: Behavior normal.       Assessment & Plan:   Problem List Items Addressed This Visit       Cardiovascular and Mediastinum   Hypertension   Relevant Medications   lisinopril (ZESTRIL) 5 MG tablet   rosuvastatin (CRESTOR) 20 MG tablet   Other Relevant Orders   CMP14+EGFR   Lipid panel     Endocrine   Thyroid disease - Primary   Relevant Orders   TSH     Other   Hyperlipidemia   Relevant Medications   lisinopril (ZESTRIL) 5 MG tablet   rosuvastatin (CRESTOR) 20 MG tablet   Other Relevant Orders   CMP14+EGFR   Lipid panel   Anemia   Relevant Orders   CBC with Differential/Platelet    Continue current medicine, encouraged to increase protein and nutrition  Will check blood work today. Follow up plan: Return in about 6 months (around 12/23/2023), or if symptoms worsen or fail to improve, for Hypertension and hyperlipidemia and hypothyroidism.  Counseling provided for all of the vaccine components Orders Placed This Encounter  Procedures   CMP14+EGFR   Lipid panel   TSH   CBC with Differential/Platelet    Arville Care, MD [facility] 06/22/2023, 9:47 AM

## 2023-06-23 LAB — TSH: TSH: 2 u[IU]/mL (ref 0.450–4.500)

## 2023-06-23 LAB — CBC WITH DIFFERENTIAL/PLATELET
Basophils Absolute: 0 10*3/uL (ref 0.0–0.2)
Basos: 1 %
EOS (ABSOLUTE): 0 10*3/uL (ref 0.0–0.4)
Eos: 1 %
Hematocrit: 36.3 % (ref 34.0–46.6)
Hemoglobin: 12 g/dL (ref 11.1–15.9)
Immature Grans (Abs): 0 10*3/uL (ref 0.0–0.1)
Immature Granulocytes: 0 %
Lymphocytes Absolute: 2 10*3/uL (ref 0.7–3.1)
Lymphs: 34 %
MCH: 31.1 pg (ref 26.6–33.0)
MCHC: 33.1 g/dL (ref 31.5–35.7)
MCV: 94 fL (ref 79–97)
Monocytes Absolute: 0.5 10*3/uL (ref 0.1–0.9)
Monocytes: 8 %
Neutrophils Absolute: 3.3 10*3/uL (ref 1.4–7.0)
Neutrophils: 56 %
Platelets: 205 10*3/uL (ref 150–450)
RBC: 3.86 x10E6/uL (ref 3.77–5.28)
RDW: 12.7 % (ref 11.7–15.4)
WBC: 5.8 10*3/uL (ref 3.4–10.8)

## 2023-06-23 LAB — LIPID PANEL
Cholesterol, Total: 129 mg/dL (ref 100–199)
HDL: 60 mg/dL (ref >39)
LDL CALC COMMENT:: 2.2 ratio (ref 0.0–4.4)
LDL Chol Calc (NIH): 56 mg/dL (ref 0–99)
Triglycerides: 59 mg/dL (ref 0–149)
VLDL Cholesterol Cal: 13 mg/dL (ref 5–40)

## 2023-06-23 LAB — CMP14+EGFR
ALT: 17 IU/L (ref 0–32)
AST: 34 IU/L (ref 0–40)
Albumin: 4.3 g/dL (ref 3.7–4.7)
Alkaline Phosphatase: 88 IU/L (ref 44–121)
BUN/Creatinine Ratio: 18 (ref 12–28)
BUN: 17 mg/dL (ref 8–27)
Bilirubin Total: 0.4 mg/dL (ref 0.0–1.2)
CO2: 23 mmol/L (ref 20–29)
Calcium: 10.4 mg/dL — ABNORMAL HIGH (ref 8.7–10.3)
Chloride: 102 mmol/L (ref 96–106)
Creatinine, Ser: 0.95 mg/dL (ref 0.57–1.00)
Globulin, Total: 2.3 g/dL (ref 1.5–4.5)
Glucose: 83 mg/dL (ref 70–99)
Potassium: 4.7 mmol/L (ref 3.5–5.2)
Sodium: 140 mmol/L (ref 134–144)
Total Protein: 6.6 g/dL (ref 6.0–8.5)
eGFR: 60 mL/min/{1.73_m2} (ref >59)

## 2023-08-04 ENCOUNTER — Ambulatory Visit: Payer: Medicare HMO

## 2023-08-04 VITALS — Ht 59.0 in | Wt 113.0 lb

## 2023-08-04 DIAGNOSIS — Z0000 Encounter for general adult medical examination without abnormal findings: Secondary | ICD-10-CM

## 2023-08-04 NOTE — Progress Notes (Signed)
 Subjective:   Christinia Huie is a 83 y.o. who presents for a Medicare Wellness preventive visit.  Visit Complete: Virtual I connected with  [name] on 08/04/23 by a audio enabled telemedicine application and verified that I am speaking with the correct person using two identifiers.  Patient Location: Home  Provider Location: Home Office  I discussed the limitations of evaluation and management by telemedicine. The patient expressed understanding and agreed to proceed.  Vital Signs: Because this visit was a virtual/telehealth visit, some criteria may be missing or patient reported. Any vitals not documented were not able to be obtained and vitals that have been documented are patient reported.  VideoDeclined- This patient declined Librarian, academic. Therefore the visit was completed with audio only.  Persons Participating in Visit: Patient.  AWV Questionnaire: No: Patient Medicare AWV questionnaire was not completed prior to this visit.  Cardiac Risk Factors include: advanced age (>61men, >75 women);dyslipidemia;hypertension     Objective:    Today's Vitals   08/04/23 1110  Weight: 113 lb (51.3 kg)  Height: 4\' 11"  (1.499 m)   Body mass index is 22.82 kg/m.     08/04/2023   11:56 AM 08/01/2022   11:20 AM 07/30/2021   12:15 PM 07/27/2020    4:20 PM 06/23/2019   10:01 AM 06/21/2018    9:13 AM 06/02/2017   10:55 AM  Advanced Directives  Does Patient Have a Medical Advance Directive? No No No No No No No  Would patient like information on creating a medical advance directive? Yes (MAU/Ambulatory/Procedural Areas - Information given) No - Patient declined No - Patient declined Yes (MAU/Ambulatory/Procedural Areas - Information given) No - Patient declined Yes (MAU/Ambulatory/Procedural Areas - Information given) Yes (MAU/Ambulatory/Procedural Areas - Information given)    Current Medications (verified) Outpatient Encounter Medications as of 08/04/2023   Medication Sig   Calcium-Vitamin D (CALTRATE 600 PLUS-VIT D PO) Take 1 tablet by mouth daily.   ferrous sulfate 324 MG TBEC Take 324 mg by mouth every other day.   levothyroxine (SYNTHROID) 50 MCG tablet TAKE 1 TABLET BY MOUTH ONCE DAILY BEFORE BREAKFAST   lisinopril (ZESTRIL) 5 MG tablet Take 1 tablet (5 mg total) by mouth daily.   Multiple Vitamin (MULTIVITAMIN) tablet Take 1 tablet by mouth daily.   rosuvastatin (CRESTOR) 20 MG tablet Take 1 tablet (20 mg total) by mouth daily.   No facility-administered encounter medications on file as of 08/04/2023.    Allergies (verified) Patient has no known allergies.   History: Past Medical History:  Diagnosis Date   Arthritis    Cataract    Bilateral  Surgery on both cant remember dates    Hyperlipidemia    Hypertension    Thyroid disease    Past Surgical History:  Procedure Laterality Date   ABDOMINAL HYSTERECTOMY  1987   CHOLECYSTECTOMY  05/31/2002   laparoscopic   DILATION AND CURETTAGE OF UTERUS  1987   EYE SURGERY Bilateral    cataracts removed    Family History  Problem Relation Age of Onset   Congestive Heart Failure Father    Breast cancer Sister 66   Lung cancer Sister    Uterine cancer Sister    Breast cancer Sister 53   Diabetes Sister    Migraines Daughter    Diabetes Brother    COPD Brother    Colon cancer Neg Hx    Stomach cancer Neg Hx    Social History   Socioeconomic History   Marital  status: Widowed    Spouse name: Not on file   Number of children: 2   Years of education: 32   Highest education level: 12th grade  Occupational History   Occupation: Retired     Comment: Barrister's clerk   Tobacco Use   Smoking status: Never   Smokeless tobacco: Never  Vaping Use   Vaping status: Never Used  Substance and Sexual Activity   Alcohol use: No   Drug use: No   Sexual activity: Not Currently  Other Topics Concern   Not on file  Social History Narrative   Lives alone   Social Drivers of  Health   Financial Resource Strain: Low Risk  (08/04/2023)   Overall Financial Resource Strain (CARDIA)    Difficulty of Paying Living Expenses: Not hard at all  Food Insecurity: No Food Insecurity (08/04/2023)   Hunger Vital Sign    Worried About Running Out of Food in the Last Year: Never true    Ran Out of Food in the Last Year: Never true  Transportation Needs: No Transportation Needs (08/04/2023)   PRAPARE - Administrator, Civil Service (Medical): No    Lack of Transportation (Non-Medical): No  Physical Activity: Insufficiently Active (08/04/2023)   Exercise Vital Sign    Days of Exercise per Week: 3 days    Minutes of Exercise per Session: 30 min  Stress: No Stress Concern Present (08/04/2023)   [employer] of Occupational Health - Occupational Stress Questionnaire    Feeling of Stress : Not at all  Social Connections: Moderately Isolated (08/04/2023)   Social Connection and Isolation Panel [NHANES]    Frequency of Communication with Friends and Family: More than three times a week    Frequency of Social Gatherings with Friends and Family: Three times a week    Attends Religious Services: More than 4 times per year    Active Member of Clubs or Organizations: No    Attends Banker Meetings: Never    Marital Status: Widowed    Tobacco Counseling Counseling given: Not Answered    Clinical Intake:  Pre-visit preparation completed: Yes  Pain : No/denies pain     Diabetes: No  Lab Results  Component Value Date   HGBA1C 6.2 (H) 11/25/2017   HGBA1C 6.1 05/28/2017   HGBA1C 5.9 (H) 11/12/2012     How often do you need to have someone help you when you read instructions, pamphlets, or other written materials from your doctor or pharmacy?: 1 - Never  Interpreter Needed?: No  Information entered by :: Seabron Cypress LPN   Activities of Daily Living     08/04/2023   11:55 AM  In your present state of health, do you have any difficulty  performing the following activities:  Hearing? 0  Vision? 0  Difficulty concentrating or making decisions? 0  Walking or climbing stairs? 0  Dressing or bathing? 0  Doing errands, shopping? 0  Preparing Food and eating ? N  Using the Toilet? N  In the past six months, have you accidently leaked urine? N  Do you have problems with loss of bowel control? N  Managing your Medications? N  Managing your Finances? N  Housekeeping or managing your Housekeeping? N    Patient Care Team: Dettinger, Lucio Sabin, MD as PCP - General (Family Medicine) Maris Sickle, MD as Consulting Physician (Ophthalmology)  Indicate any recent Medical Services you may have received from other than [facility] providers in the past  year (date may be approximate).     Assessment:   This is a routine wellness examination for [place].  Hearing/Vision screen Hearing Screening - Comments:: Denies hearing difficulties   Vision Screening - Comments:: Wears rx glasses - up to date with routine eye exams with Dr. Candi Chafe    Goals Addressed   None    Depression Screen     08/04/2023   11:54 AM 06/22/2023    9:32 AM 12/17/2022    9:32 AM 08/01/2022   11:19 AM 06/13/2022   10:11 AM 12/11/2021    9:12 AM 07/30/2021   12:04 PM  PHQ 2/9 Scores  PHQ - 2 Score 0 0 0 0 0 0 0  PHQ- 9 Score   0 0   0    Fall Risk     08/04/2023   11:55 AM 06/22/2023    9:32 AM 12/17/2022    9:32 AM 08/01/2022   11:18 AM 06/13/2022   10:11 AM  Fall Risk   Falls in the past year? 0 0 1 0 0  Number falls in past yr: 0  0 0   Injury with Fall? 0  0 0   Risk for fall due to : No Fall Risks  Impaired balance/gait No Fall Risks   Follow up Falls prevention discussed;Education provided;Falls evaluation completed  Falls evaluation completed Falls prevention discussed     MEDICARE RISK AT HOME:  Medicare Risk at Home Any stairs in or around the home?: No If so, are there any without handrails?: No Home free of loose throw rugs in walkways, pet beds,  electrical cords, etc?: Yes Adequate lighting in your home to reduce risk of falls?: Yes Life alert?: No Use of a cane, walker or w/c?: No Grab bars in the bathroom?: Yes Shower chair or bench in shower?: No Elevated toilet seat or a handicapped toilet?: Yes  TIMED UP AND GO:  Was the test performed?  No  Cognitive Function: 6CIT completed    06/21/2018    9:17 AM 06/02/2017    9:41 AM  MMSE - Mini Mental State Exam  Orientation to time 5 5  Orientation to Place 5 5  Registration 3 3  Attention/ Calculation 5 5  Recall 3 3  Language- name 2 objects 2 2  Language- repeat 1 1  Language- follow 3 step command 3 3  Language- read & follow direction 1 1  Write a sentence 1 1  Copy design 1 1  Total score 30 30        08/04/2023   11:55 AM 08/01/2022   11:20 AM 07/30/2021   12:06 PM 06/23/2019   10:05 AM  6CIT Screen  What Year? 0 points 0 points 0 points 0 points  What month? 0 points 0 points 0 points 0 points  What time? 0 points 0 points 0 points 0 points  Count back from 20 0 points 0 points 0 points 0 points  Months in reverse 0 points 0 points 0 points 0 points  Repeat phrase 0 points 0 points 0 points 0 points  Total Score 0 points 0 points 0 points 0 points    Immunizations Immunization History  Administered Date(s) Administered   Fluad Quad(high Dose 65+) 02/04/2019, 02/21/2020   Influenza, High Dose Seasonal PF 02/01/2016, 02/01/2016, 02/10/2017, 02/10/2017, 02/22/2018, 02/22/2018   Influenza,inj,Quad PF,6+ Mos 01/24/2013   Influenza-Unspecified 02/28/2004, 01/31/2005, 01/31/2006, 02/01/2007, 05/10/2008, 01/19/2009, 03/25/2010, 02/04/2011, 02/11/2012, 01/25/2013, 02/01/2014, 03/06/2015, 03/21/2021   Moderna Sars-Covid-2 Vaccination 05/17/2019,  06/14/2019   Pneumococcal Conjugate-13 12/22/2013   Pneumococcal Polysaccharide-23 02/11/2012   Pneumococcal-Unspecified 11/20/2010   Td 03/09/2007, 02/04/2011   Tdap 07/21/2006, 02/04/2011, 12/17/2022   Zoster  Recombinant(Shingrix) 09/09/2017, 11/25/2017   Zoster, Live 04/21/2006, 04/20/2012    Screening Tests Health Maintenance  Topic Date Due   COVID-19 Vaccine (3 - Moderna risk series) 07/12/2019   DEXA SCAN  12/17/2023 (Originally 06/03/2019)   INFLUENZA VACCINE  11/20/2023   Medicare Annual Wellness (AWV)  08/03/2024   DTaP/Tdap/Td (6 - Td or Tdap) 12/16/2032   Pneumonia Vaccine 94+ Years old  Completed   Zoster Vaccines- Shingrix  Completed   HPV VACCINES  Aged Out   Meningococcal B Vaccine  Aged Out    Health Maintenance  Health Maintenance Due  Topic Date Due   COVID-19 Vaccine (3 - Moderna risk series) 07/12/2019    Additional Screening:  Vision Screening: Recommended annual ophthalmology exams for early detection of glaucoma and other disorders of the eye.  Dental Screening: Recommended annual dental exams for proper oral hygiene  Community Resource Referral / Chronic Care Management: CRR required this visit?  No   CCM required this visit?  No     Plan:     I have personally reviewed and noted the following in the patient's chart:   Medical and social history Use of alcohol, tobacco or illicit drugs  Current medications and supplements including opioid prescriptions. Patient is not currently taking opioid prescriptions. Functional ability and status Nutritional status Physical activity Advanced directives List of other physicians Hospitalizations, surgeries, and ER visits in previous 12 months Vitals Screenings to include cognitive, depression, and falls Referrals and appointments  In addition, I have reviewed and discussed with patient certain preventive protocols, quality metrics, and best practice recommendations. A written personalized care plan for preventive services as well as general preventive health recommendations were provided to patient.     Seabron Cypress [place], [place]   8/75/6433   After Visit Summary: (Declined) Due to this being a  telephonic visit, with patients personalized plan was offered to patient but patient Declined AVS at this time   Notes: Nothing significant to report at this time.

## 2023-08-04 NOTE — Patient Instructions (Signed)
 Ms. Kissner , Thank you for taking time to come for your Medicare Wellness Visit. I appreciate your ongoing commitment to your health goals. Please review the following plan we discussed and let me know if I can assist you in the future.   Referrals/Orders/Follow-Ups/Clinician Recommendations: Aim for 30 minutes of exercise or brisk walking, 6-8 glasses of water, and 5 servings of fruits and vegetables each day.  This is a list of the screening recommended for you and due dates:  Health Maintenance  Topic Date Due   COVID-19 Vaccine (3 - Moderna risk series) 07/12/2019   DEXA scan (bone density measurement)  12/17/2023*   Flu Shot  11/20/2023   Medicare Annual Wellness Visit  08/03/2024   DTaP/Tdap/Td vaccine (6 - Td or Tdap) 12/16/2032   Pneumonia Vaccine  Completed   Zoster (Shingles) Vaccine  Completed   HPV Vaccine  Aged Out   Meningitis B Vaccine  Aged Out  *Topic was postponed. The date shown is not the original due date.    Advanced directives: (ACP Link)Information on Advanced Care Planning can be found at [place]  Secretary of [facility] Advance Health Care Directives Advance Health Care Directives. [email or web address]   Next Medicare Annual Wellness Visit scheduled for next year: Yes

## 2023-12-23 ENCOUNTER — Encounter: Payer: Self-pay | Admitting: Family Medicine

## 2023-12-23 ENCOUNTER — Ambulatory Visit (INDEPENDENT_AMBULATORY_CARE_PROVIDER_SITE_OTHER): Admitting: Family Medicine

## 2023-12-23 VITALS — BP 126/63 | HR 71 | Temp 97.2°F | Ht 59.0 in | Wt 115.0 lb

## 2023-12-23 DIAGNOSIS — E079 Disorder of thyroid, unspecified: Secondary | ICD-10-CM | POA: Diagnosis not present

## 2023-12-23 DIAGNOSIS — E7800 Pure hypercholesterolemia, unspecified: Secondary | ICD-10-CM

## 2023-12-23 DIAGNOSIS — I10 Essential (primary) hypertension: Secondary | ICD-10-CM

## 2023-12-23 LAB — LIPID PANEL

## 2023-12-23 MED ORDER — LEVOTHYROXINE SODIUM 50 MCG PO TABS: 50.0000 ug | ORAL_TABLET | Freq: Every day | ORAL | 1 refills | Status: AC

## 2023-12-23 NOTE — Progress Notes (Signed)
 BP 126/63   Pulse 71   Temp (!) 97.2 F (36.2 C)   Ht 4' 11 (1.499 m)   Wt 115 lb (52.2 kg)   SpO2 96%   BMI 23.23 kg/m    Subjective:   Patient ID: Sherry Cooper, female    DOB: 1940/07/21, 83 y.o.   MRN: [id number]  HPI: Sherry Cooper is a 83 y.o. female presenting on 12/23/2023 for Medical Management of Chronic Issues, Hypothyroidism, and Hypertension   Discussed the use of AI scribe software for clinical note transcription with the patient, who gave verbal consent to proceed.  History of Present Illness   Sherry Cooper is an 83 year old female with hypothyroidism, hypertension, and hyperlipidemia who presents for a recheck.  She is currently taking Synthroid  for hypothyroidism and experiences discomfort associated with its use.  For hyperlipidemia, she is taking Crestor  daily without any side effects.  Regarding hypertension, she is on a reduced dose of lisinopril  at 5 mg daily. She reports no symptoms of lightheadedness or dizziness and feels better with this adjustment. She has a home blood pressure monitor but requires assistance from her son-in-law to operate it.  She recalls a fall last year but reports no recent falls.  She mentions arthritis causing stiffness and affecting her mobility, describing it as 'good old stiff joints'.  She is due for a bone density test.          Relevant past medical, surgical, family and social history reviewed and updated as indicated. Interim medical history since our last visit reviewed. Allergies and medications reviewed and updated.  Review of Systems  Constitutional:  Negative for chills and fever.  Eyes:  Negative for visual disturbance.  Respiratory:  Negative for chest tightness and shortness of breath.   Cardiovascular:  Negative for chest pain and leg swelling.  Musculoskeletal:  Positive for arthralgias. Negative for back pain and gait problem.  Skin:  Negative for rash.  Neurological:  Negative for dizziness,  light-headedness and headaches.  Psychiatric/Behavioral:  Negative for agitation and behavioral problems.   All other systems reviewed and are negative.   Per HPI unless specifically indicated above   Allergies as of 12/23/2023   No Known Allergies      Medication List        Accurate as of December 23, 2023  9:42 AM. If you have any questions, ask your nurse or doctor.          CALTRATE 600 PLUS-VIT D PO Take 1 tablet by mouth daily.   ferrous sulfate 324 MG Tbec Take 324 mg by mouth every other day.   levothyroxine  50 MCG tablet Commonly known as: SYNTHROID  Take 1 tablet (50 mcg total) by mouth daily before breakfast.   lisinopril  5 MG tablet Commonly known as: ZESTRIL  Take 1 tablet (5 mg total) by mouth daily.   multivitamin tablet Take 1 tablet by mouth daily.   rosuvastatin  20 MG tablet Commonly known as: CRESTOR  Take 1 tablet (20 mg total) by mouth daily.         Objective:   BP 126/63   Pulse 71   Temp (!) 97.2 F (36.2 C)   Ht 4' 11 (1.499 m)   Wt 115 lb (52.2 kg)   SpO2 96%   BMI 23.23 kg/m   Wt Readings from Last 3 Encounters:  12/23/23 115 lb (52.2 kg)  08/04/23 113 lb (51.3 kg)  06/22/23 113 lb (51.3 kg)    Physical Exam Physical Exam  VITALS: BP- 126/63 NECK: Thyroid  without nodules. CHEST: Lungs clear to auscultation bilaterally. CARDIOVASCULAR: Heart regular rate and rhythm. EXTREMITIES: No edema in lower extremities.         Assessment & Plan:   Problem List Items Addressed This Visit       Cardiovascular and Mediastinum   Hypertension - Primary   Relevant Orders   CBC with Differential/Platelet     Endocrine   Thyroid  disease   Relevant Medications   levothyroxine  (SYNTHROID ) 50 MCG tablet   Other Relevant Orders   TSH     Other   Hyperlipidemia   Relevant Orders   CBC with Differential/Platelet   CMP14+EGFR   Lipid panel       Hypothyroidism Managed with Synthroid  without issues. - Order blood  work to assess thyroid  function.  Essential hypertension Well-controlled with lisinopril  5 mg. Blood pressure 126/63 mmHg, no symptoms. - Continue lisinopril  5 mg daily. - Advise home blood pressure monitoring. - Instruct to report lightheadedness or dizziness.  Hyperlipidemia Managed with Crestor . - Order blood work to assess lipid levels.  Osteoarthritis Causes stiffness and joint pain, affecting mobility.  General Health Maintenance Due for a bone density scan. - Schedule bone density scan.          Follow up plan: Return in about 6 months (around 06/21/2024), or if symptoms worsen or fail to improve, for thyroid  and cholesterol.  Counseling provided for all of the vaccine components Orders Placed This Encounter  Procedures   CBC with Differential/Platelet   CMP14+EGFR   Lipid panel   TSH    Fonda Levins, MD [facility] 12/23/2023, 9:42 AM

## 2023-12-24 LAB — CBC WITH DIFFERENTIAL/PLATELET
Basophils Absolute: 0 x10E3/uL (ref 0.0–0.2)
Basos: 1 %
EOS (ABSOLUTE): 0 x10E3/uL (ref 0.0–0.4)
Eos: 1 %
Hematocrit: 34.2 % (ref 34.0–46.6)
Hemoglobin: 11.1 g/dL (ref 11.1–15.9)
Immature Grans (Abs): 0 x10E3/uL (ref 0.0–0.1)
Immature Granulocytes: 0 %
Lymphocytes Absolute: 1.9 x10E3/uL (ref 0.7–3.1)
Lymphs: 23 %
MCH: 31.1 pg (ref 26.6–33.0)
MCHC: 32.5 g/dL (ref 31.5–35.7)
MCV: 96 fL (ref 79–97)
Monocytes Absolute: 0.7 x10E3/uL (ref 0.1–0.9)
Monocytes: 8 %
Neutrophils Absolute: 5.5 x10E3/uL (ref 1.4–7.0)
Neutrophils: 67 %
Platelets: 216 x10E3/uL (ref 150–450)
RBC: 3.57 x10E6/uL — ABNORMAL LOW (ref 3.77–5.28)
RDW: 12.2 % (ref 11.7–15.4)
WBC: 8.1 x10E3/uL (ref 3.4–10.8)

## 2023-12-24 LAB — CMP14+EGFR
ALT: 18 IU/L (ref 0–32)
AST: 30 IU/L (ref 0–40)
Albumin: 4.5 g/dL (ref 3.7–4.7)
Alkaline Phosphatase: 89 IU/L (ref 44–121)
BUN/Creatinine Ratio: 21 (ref 12–28)
BUN: 22 mg/dL (ref 8–27)
Bilirubin Total: 0.3 mg/dL (ref 0.0–1.2)
CO2: 20 mmol/L (ref 20–29)
Calcium: 10.3 mg/dL (ref 8.7–10.3)
Chloride: 102 mmol/L (ref 96–106)
Creatinine, Ser: 1.06 mg/dL — AB (ref 0.57–1.00)
Globulin, Total: 2.1 g/dL (ref 1.5–4.5)
Glucose: 91 mg/dL (ref 70–99)
Potassium: 4.7 mmol/L (ref 3.5–5.2)
Sodium: 140 mmol/L (ref 134–144)
Total Protein: 6.6 g/dL (ref 6.0–8.5)
eGFR: 52 mL/min/1.73 — AB (ref >59)

## 2023-12-24 LAB — LIPID PANEL
Cholesterol, Total: 137 mg/dL (ref 100–199)
HDL: 64 mg/dL (ref >39)
LDL CALC COMMENT:: 2.1 ratio (ref 0.0–4.4)
LDL Chol Calc (NIH): 63 mg/dL (ref 0–99)
Triglycerides: 39 mg/dL (ref 0–149)
VLDL Cholesterol Cal: 10 mg/dL (ref 5–40)

## 2023-12-24 LAB — TSH: TSH: 2.68 u[IU]/mL (ref 0.450–4.500)

## 2023-12-28 ENCOUNTER — Telehealth: Payer: Self-pay

## 2023-12-28 ENCOUNTER — Ambulatory Visit: Payer: Self-pay | Admitting: Family Medicine

## 2023-12-28 NOTE — Telephone Encounter (Signed)
 Copied from CRM [id number]. Topic: Clinical - Lab/Test Results >> Dec 28, 2023 10:56 AM Antwanette L wrote: Reason for CRM: Pt called to get lab results from 12/23/23. Provider has not left his notes on the results. Please contact the patient at [phone] when results are ready

## 2024-02-15 ENCOUNTER — Other Ambulatory Visit: Payer: Self-pay | Admitting: Family Medicine

## 2024-02-15 DIAGNOSIS — Z1231 Encounter for screening mammogram for malignant neoplasm of breast: Secondary | ICD-10-CM

## 2024-04-08 ENCOUNTER — Inpatient Hospital Stay: Admission: RE | Admit: 2024-04-08 | Discharge: 2024-04-08 | Attending: Family Medicine

## 2024-04-08 DIAGNOSIS — Z1231 Encounter for screening mammogram for malignant neoplasm of breast: Secondary | ICD-10-CM

## 2024-06-22 ENCOUNTER — Ambulatory Visit: Payer: Self-pay | Admitting: Family Medicine

## 2024-08-04 ENCOUNTER — Ambulatory Visit: Payer: Self-pay
